# Patient Record
Sex: Female | Born: 1955 | Race: White | Hispanic: No | Smoking: Former smoker
Health system: Southern US, Community
[De-identification: ages and names within clinical notes are randomized; demographics above are authoritative.]

## PROBLEM LIST (undated history)

## (undated) DIAGNOSIS — F419 Anxiety disorder, unspecified: Secondary | ICD-10-CM

## (undated) DIAGNOSIS — F32A Depression, unspecified: Secondary | ICD-10-CM

## (undated) DIAGNOSIS — E119 Type 2 diabetes mellitus without complications: Secondary | ICD-10-CM

## (undated) DIAGNOSIS — E669 Obesity, unspecified: Secondary | ICD-10-CM

## (undated) DIAGNOSIS — Z860100 Personal history of colon polyps, unspecified: Secondary | ICD-10-CM

## (undated) DIAGNOSIS — G4730 Sleep apnea, unspecified: Secondary | ICD-10-CM

## (undated) DIAGNOSIS — E7800 Pure hypercholesterolemia, unspecified: Secondary | ICD-10-CM

## (undated) DIAGNOSIS — J45909 Unspecified asthma, uncomplicated: Secondary | ICD-10-CM

## (undated) DIAGNOSIS — I10 Essential (primary) hypertension: Secondary | ICD-10-CM

## (undated) DIAGNOSIS — I4891 Unspecified atrial fibrillation: Secondary | ICD-10-CM

## (undated) DIAGNOSIS — E079 Disorder of thyroid, unspecified: Secondary | ICD-10-CM

## (undated) DIAGNOSIS — I499 Cardiac arrhythmia, unspecified: Secondary | ICD-10-CM

## (undated) DIAGNOSIS — K1123 Chronic sialoadenitis: Secondary | ICD-10-CM

## (undated) HISTORY — PX: OTHER SURGICAL HISTORY: SHX169

## (undated) HISTORY — DX: Depression, unspecified: F32.A

## (undated) HISTORY — DX: Unspecified atrial fibrillation: I48.91

## (undated) HISTORY — PX: ROTATOR CUFF REPAIR: SHX139

## (undated) HISTORY — DX: Obesity, unspecified: E66.9

## (undated) HISTORY — DX: Unspecified asthma, uncomplicated: J45.909

## (undated) HISTORY — DX: Personal history of colon polyps, unspecified: Z86.0100

## (undated) HISTORY — DX: Chronic sialoadenitis: K11.23

## (undated) HISTORY — DX: Cardiac arrhythmia, unspecified: I49.9

## (undated) HISTORY — DX: Anxiety disorder, unspecified: F41.9

## (undated) HISTORY — DX: Personal history of colonic polyps: Z86.010

## (undated) HISTORY — DX: Essential (primary) hypertension: I10

## (undated) HISTORY — DX: Sleep apnea, unspecified: G47.30

## (undated) HISTORY — DX: Type 2 diabetes mellitus without complications: E11.9

## (undated) HISTORY — PX: CARDIAC ELECTROPHYSIOLOGY MAPPING AND ABLATION: SHX1292

## (undated) HISTORY — DX: Pure hypercholesterolemia, unspecified: E78.00

## (undated) HISTORY — DX: Disorder of thyroid, unspecified: E07.9

---

## 2001-07-01 ENCOUNTER — Ambulatory Visit (HOSPITAL_BASED_OUTPATIENT_CLINIC_OR_DEPARTMENT_OTHER): Admission: RE | Admit: 2001-07-01 | Discharge: 2001-07-01 | Payer: Self-pay | Admitting: Orthopedic Surgery

## 2001-10-06 HISTORY — PX: SUBMANDIBULAR GLAND EXCISION: SHX2456

## 2004-10-19 ENCOUNTER — Encounter: Admission: RE | Admit: 2004-10-19 | Discharge: 2004-10-19 | Payer: Self-pay | Admitting: Orthopedic Surgery

## 2004-10-24 ENCOUNTER — Ambulatory Visit (HOSPITAL_BASED_OUTPATIENT_CLINIC_OR_DEPARTMENT_OTHER): Admission: RE | Admit: 2004-10-24 | Discharge: 2004-10-24 | Payer: Self-pay | Admitting: Orthopedic Surgery

## 2004-10-24 ENCOUNTER — Ambulatory Visit (HOSPITAL_COMMUNITY): Admission: RE | Admit: 2004-10-24 | Discharge: 2004-10-24 | Payer: Self-pay | Admitting: Orthopedic Surgery

## 2004-10-24 ENCOUNTER — Encounter (INDEPENDENT_AMBULATORY_CARE_PROVIDER_SITE_OTHER): Payer: Self-pay | Admitting: Specialist

## 2007-09-04 HISTORY — PX: EXCISION OF BREAST LESION: SHX6676

## 2008-04-07 ENCOUNTER — Ambulatory Visit (HOSPITAL_COMMUNITY): Admission: RE | Admit: 2008-04-07 | Discharge: 2008-04-07 | Payer: Self-pay | Admitting: Neurosurgery

## 2010-08-21 NOTE — Op Note (Signed)
[name], [name] NO.:  [id number]   MEDICAL RECORD NO.:  [id number]          PATIENT TYPE:  OIB   LOCATION:  3533                         FACILITY:  [facility]   PHYSICIAN:  Coletta Memos, M.D.     DATE OF BIRTH:  [birth year]   DATE OF PROCEDURE:  04/07/2008  DATE OF DISCHARGE:  04/07/2008                               OPERATIVE REPORT   PREOPERATIVE DIAGNOSES:  1. C6-7 spondylosis.  2. Left C7 radiculopathy.   POSTOPERATIVE DIAGNOSES:  1. C6-7 spondylosis.  2. Left C7 radiculopathy.   PROCEDURE:  Anterior cervical decompression, arthrodesis C6-7 with 6-mm  structural allograft.   COMPLICATIONS:  None.   INDICATIONS:  Molly Patton presented with severe pain in the left upper  extremity.  MRI revealed significant spondylitic change present at C6-7  along with disk and osteophyte on the left side.  I offered and she  agreed to undergo operative decompression.  She says she was just in too  much pain.   OPERATIVE NOTE:  Molly Patton was brought to the operating room.  She was  intubated and placed under a general anesthetic without difficulty.  Her  head was positioned on a horseshoe headrest in neutral fashion.  Her  neck was prepped and she was draped in a sterile fashion.  I infiltrated  4 mL of 0.5% lidocaine with 1:200,000 strength epinephrine in a skin  fold at the cricoid cartilage.  I opened the skin with a #10 blade and  took this down through the subcutaneous tissue to the platysma.  I  opened the platysma in a horizontal fashion using Metzenbaum scissors.  I then was able to dissect between the planes and identified strap  muscles and the omohyoid.  I retracted the omohyoid medially along with  the strap muscles and then was able to place a needle in the cervical  spine.  First x-ray, I could not see where I placed the needle, so I  placed 2 more needles I could count inferiorly.  Having done I was able  to identify the C6-7 space.  I then placed  distraction pins one at C6  one at C7 and opened the disk space.   I decompressed the spinal canal at C6-7 by removing the disk in a  piecemeal fashion using curettes, pituitary rongeurs, Kerrison punches,  and a high power drill.  I also used the drill to remove osteophytes and  enlarge the disk space and further decompress the spinal canal.  I  thoroughly decompressed both C7 nerve roots on the right and left.  I  removed the uncovertebral osteophyte on the left side and that nerves  had a free egress when I was finished completed.  I then prepared for  the arthrodesis having completed the decompression.   I used high-speed drill to even the surfaces of C6 and C7.  I then  placed a 6-mm allograft into the space.  I then removed the distraction  pins.  I then placed a plate using 2 screws at C6, 2 screws at C7, self-  tapping screw first by  drilling  a hole.  This was done without difficulty.  I did not take  another x-ray as we were unable to see the C6-7 disk space at all.  I  irrigated.  I then closed the wound in layered fashion using Vicryl  sutures to reapproximate the platysma and then subcuticular layers.  Dermabond was used for sterile dressing.           ______________________________  Coletta Memos, M.D.     KC/MEDQ  D:  04/07/2008  T:  04/08/2008  Job:  [id number]

## 2010-08-24 NOTE — Op Note (Signed)
[place]  Patient:    Molly Patton, Molly Patton Visit Number: [id number] MRN: [id number]          Service Type: DSU Location: [facility] Attending Physician:  Alinda Deem Dictated by:   Alinda Deem, M.D. Proc. Date: 07/01/01 Admit Date:  07/01/2001                             Operative Report  PREOPERATIVE DIAGNOSIS:  Left shoulder pain after arthroscopic decompression and rotator cuff repair with mass in the subacromial space by MRI scan.  POSTOPERATIVE DIAGNOSIS:  Loose suture in the subacromial space of the left shoulder, some residual anterolateral spur of the subacromion and minor tearing of the labrum.  OPERATION PERFORMED:  Left shoulder arthroscopic debridement of the subacromial space with removal of ball of suture.  Redo acromioplasty removing the anterolateral aspect of the acromial spur.  Incidental debridement of the labrum when we were evaluating the glenohumeral joint.  The rotator cuff was intact.  SURGEON:  Alinda Deem, M.D.  ASSISTANT:  Dorthula Matas, P.A.-C.  ANESTHESIA:  General endotracheal.  ESTIMATED BLOOD LOSS:  Minimal.  FLUID REPLACEMENT:  800 cc crystalloid.  DRAINS:  None.  TOURNIQUET TIME:  None.  INDICATIONS FOR PROCEDURE:  The patient is a 55 year old woman with left shoulder impingement syndrome who underwent arthroscopic acromioplasty and open miniarthrotomy and rotator cuff repair by another physician last year. She has had persistent pain, catching and popping.  I did a second opinion evaluation on her a month or two ago and the MRI scan that I ordered showed a 7 or 8 mm ball-like mass deforming the rotator cuff in the subacromial space. The plain x-rays were clear and because of the persistent pain, popping and catching, she is taken for arthroscopic evaluation and treatment of the left shoulder.  DESCRIPTION OF PROCEDURE:  The patient was identified by arm band and taken to the  operating room at [facility] where the appropriate anesthetic monitors were attached and general endotracheal anesthesia induced with the patient in the supine position.  She was then placed in the beach chair position and the left upper extremity prepped and draped in the usual sterile fashion from the wrist to the hemithorax.  The skin along the anterolateral and posterior aspects of the acromion process was infiltrated with 3 to 4 cc 0.5% Marcaine with epinephrine solution per quadrant and then standard portals were made 1.5 cm anterior to the [facility] joint, lateral to the junction of the middle and posterior thirds of the acromion, posterior to the posterolateral corner of the acromion process with a #11 blade.  The inflow was placed anteriorly into the subacromial space, the arthroscope laterally and a 4.2 mm [place] su[place] shaver posteriorly allowing subacromial bursectomy although we immediately identified a ball of suture that was floating around in the subacromial space loose and this was removed with a grasping forceps.  We then performed a subacromial bursectomy, identified anterolateral spurring of the acromion which was debrided back to a stable margin using a 4.5 hooded Vortex bur.  The spur that was anterolateral was somewhat sharp and probably was causing some impingement.  The arthroscope was then repositioned into the glenohumeral joint using a posterior portal allowing [place] to debride an incidental labral tear.  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 At this point  the shoulder was washed out with normal saline solution.  The [facility] joint was also evaluated and showed some remaining cartilage and she had not been particularly tender over the [facility] joint.  The arthroscopic instruments were removed.  A dressing of Xeroform, 4 x 4 dressing sponges, and paper tape applied.  The patient  was awakened and taken to the recovery room without difficulty. Dictated by:   Alinda Deem, M.D. Attending Physician:  Alinda Deem DD:  07/01/01 TD:  07/02/01 Job: [id number]

## 2010-08-24 NOTE — Op Note (Signed)
Molly Patton, Molly Patton               ACCOUNT NO.:  [id number]   MEDICAL RECORD NO.:  [id number]          PATIENT TYPE:  AMB   LOCATION:  DSC                          FACILITY:  [facility]   PHYSICIAN:  Cindee Salt, M.D.       DATE OF BIRTH:  11/28/1955   DATE OF PROCEDURE:  10/24/2004  DATE OF DISCHARGE:                                 OPERATIVE REPORT   PREOPERATIVE DIAGNOSIS:  Ulnar neuropathy with ganglion cyst, Guillain's  canal, right wrist.   POSTOPERATIVE DIAGNOSIS:  Ulnar neuropathy with ganglion cyst, Guillain's  canal, right wrist.   OPERATION:  Release Guillain's canal, excision volar ulnar wrist ganglion,  right wrist.   SURGEON:  Kuzma.   ASSISTANT:  Carolyne Fiscal R.N.   ANESTHESIA:  General.   HISTORY:  The patient is a 55 year old female with a history of numbness and  tingling ring little finger, tenderness at her wrist. MRI reveals a cyst in  Guillain's canal.  Nerve conductions reveal ulnar neuropathy secondary to  the cyst and compression.   PROCEDURE:  The patient is brought to the operating room where a general  anesthetic was carried out without difficulty after marking the arm  confirming with the patient.  The limb was exsanguinated with an Esmarch  bandage.  Tourniquet placed on the arm was inflated 250 mmHg.  Prep was done  with DuraPrep.  The volar zigzag incision was made to allow extension  proximally and distally, carried down through subcutaneous tissue. The  overlying musculature to Guillain's canal was then released.  The nerve was  identified along with the artery.  Deep branch was then also released.  The  wound was extended slightly proximal and a large cyst was present. This was  found to arise from the radiocarpal joint with blunt dissection after  placing retractors for protection of the superficial and deep branch of the  ulnar nerve. The cyst was excised in toto. The area capsule was debrided.  This arose from the ulna triquetral ligament which was  obviously deficient.  The wound was copiously irrigated with saline. The subcutaneous tissue was  then closed with interrupted 4-0 Vicryl sutures and the skin with a  subcuticular 3-0 Monocryl suture. Steri-Strips were applied. Sterile  compressive dressing and splint applied. The patient tolerated the procedure  well and was taken to the recovery room for observation in satisfactory  condition. She is discharged home to return to the [facility]  in one week on Vicodin.       GK/MEDQ  D:  10/24/2004  T:  10/24/2004  Job:  [id number]

## 2011-01-10 LAB — BASIC METABOLIC PANEL
BUN: 12 mg/dL (ref 6–23)
Calcium: 8.9 mg/dL (ref 8.4–10.5)
Chloride: 104 mEq/L (ref 96–112)
GFR calc Af Amer: >60 mL/min (ref >60)
GFR calc non Af Amer: >60 mL/min (ref >60)
Glucose, Bld: 107 mg/dL — ABNORMAL HIGH (ref 70–99)
Potassium: 4.6 mEq/L (ref 3.5–5.1)

## 2011-01-10 LAB — CBC
MCHC: 32.8 g/dL (ref 30.0–36.0)
MCV: 90.7 fL (ref 78.0–100.0)
RBC: 4.53 MIL/uL (ref 3.87–5.11)
RDW: 14.9 % (ref 11.5–15.5)
WBC: 8.4 10*3/uL (ref 4.0–10.5)

## 2013-12-28 DIAGNOSIS — E0500 Thyrotoxicosis with diffuse goiter without thyrotoxic crisis or storm: Secondary | ICD-10-CM | POA: Insufficient documentation

## 2014-09-14 ENCOUNTER — Other Ambulatory Visit: Payer: Self-pay

## 2014-09-14 HISTORY — PX: COLONOSCOPY: SHX174

## 2014-10-21 DIAGNOSIS — H50041 Monocular esotropia with other noncomitancies, right eye: Secondary | ICD-10-CM | POA: Insufficient documentation

## 2014-10-21 DIAGNOSIS — H532 Diplopia: Secondary | ICD-10-CM | POA: Insufficient documentation

## 2014-12-17 DIAGNOSIS — H02534 Eyelid retraction left upper eyelid: Secondary | ICD-10-CM | POA: Insufficient documentation

## 2014-12-17 DIAGNOSIS — H05243 Constant exophthalmos, bilateral: Secondary | ICD-10-CM | POA: Insufficient documentation

## 2015-01-24 DIAGNOSIS — I480 Paroxysmal atrial fibrillation: Secondary | ICD-10-CM | POA: Insufficient documentation

## 2015-02-14 DIAGNOSIS — I471 Supraventricular tachycardia: Secondary | ICD-10-CM | POA: Insufficient documentation

## 2015-02-14 DIAGNOSIS — Z8639 Personal history of other endocrine, nutritional and metabolic disease: Secondary | ICD-10-CM | POA: Insufficient documentation

## 2015-05-09 DIAGNOSIS — N179 Acute kidney failure, unspecified: Secondary | ICD-10-CM | POA: Insufficient documentation

## 2015-05-09 DIAGNOSIS — R7302 Impaired glucose tolerance (oral): Secondary | ICD-10-CM | POA: Insufficient documentation

## 2015-06-28 DIAGNOSIS — G4734 Idiopathic sleep related nonobstructive alveolar hypoventilation: Secondary | ICD-10-CM | POA: Insufficient documentation

## 2015-06-28 DIAGNOSIS — G4733 Obstructive sleep apnea (adult) (pediatric): Secondary | ICD-10-CM | POA: Insufficient documentation

## 2016-06-13 DIAGNOSIS — Z79899 Other long term (current) drug therapy: Secondary | ICD-10-CM | POA: Diagnosis not present

## 2016-06-13 DIAGNOSIS — E039 Hypothyroidism, unspecified: Secondary | ICD-10-CM | POA: Diagnosis not present

## 2016-06-13 DIAGNOSIS — M1990 Unspecified osteoarthritis, unspecified site: Secondary | ICD-10-CM | POA: Diagnosis not present

## 2016-06-13 DIAGNOSIS — E099 Drug or chemical induced diabetes mellitus without complications: Secondary | ICD-10-CM | POA: Diagnosis not present

## 2016-08-07 DIAGNOSIS — I480 Paroxysmal atrial fibrillation: Secondary | ICD-10-CM | POA: Diagnosis not present

## 2016-08-07 DIAGNOSIS — G4733 Obstructive sleep apnea (adult) (pediatric): Secondary | ICD-10-CM | POA: Diagnosis not present

## 2016-08-07 DIAGNOSIS — Z8639 Personal history of other endocrine, nutritional and metabolic disease: Secondary | ICD-10-CM | POA: Diagnosis not present

## 2016-08-26 DIAGNOSIS — Z1389 Encounter for screening for other disorder: Secondary | ICD-10-CM | POA: Diagnosis not present

## 2016-08-26 DIAGNOSIS — M545 Low back pain: Secondary | ICD-10-CM | POA: Diagnosis not present

## 2016-08-26 DIAGNOSIS — M5136 Other intervertebral disc degeneration, lumbar region: Secondary | ICD-10-CM | POA: Diagnosis not present

## 2016-08-30 DIAGNOSIS — H532 Diplopia: Secondary | ICD-10-CM | POA: Diagnosis not present

## 2016-08-30 DIAGNOSIS — H26493 Other secondary cataract, bilateral: Secondary | ICD-10-CM | POA: Diagnosis not present

## 2016-08-30 DIAGNOSIS — H0520 Unspecified exophthalmos: Secondary | ICD-10-CM | POA: Diagnosis not present

## 2016-09-09 DIAGNOSIS — Z0000 Encounter for general adult medical examination without abnormal findings: Secondary | ICD-10-CM | POA: Diagnosis not present

## 2016-09-09 DIAGNOSIS — Z1231 Encounter for screening mammogram for malignant neoplasm of breast: Secondary | ICD-10-CM | POA: Diagnosis not present

## 2016-09-27 DIAGNOSIS — M818 Other osteoporosis without current pathological fracture: Secondary | ICD-10-CM | POA: Diagnosis not present

## 2016-09-27 DIAGNOSIS — Z1231 Encounter for screening mammogram for malignant neoplasm of breast: Secondary | ICD-10-CM | POA: Diagnosis not present

## 2016-09-27 DIAGNOSIS — M810 Age-related osteoporosis without current pathological fracture: Secondary | ICD-10-CM | POA: Diagnosis not present

## 2016-09-27 DIAGNOSIS — M8589 Other specified disorders of bone density and structure, multiple sites: Secondary | ICD-10-CM | POA: Diagnosis not present

## 2016-11-14 DIAGNOSIS — E039 Hypothyroidism, unspecified: Secondary | ICD-10-CM | POA: Diagnosis not present

## 2016-12-24 DIAGNOSIS — M1990 Unspecified osteoarthritis, unspecified site: Secondary | ICD-10-CM | POA: Diagnosis not present

## 2016-12-24 DIAGNOSIS — E039 Hypothyroidism, unspecified: Secondary | ICD-10-CM | POA: Diagnosis not present

## 2017-01-06 DIAGNOSIS — I480 Paroxysmal atrial fibrillation: Secondary | ICD-10-CM | POA: Diagnosis not present

## 2017-01-10 DIAGNOSIS — M545 Low back pain: Secondary | ICD-10-CM | POA: Diagnosis not present

## 2017-01-10 DIAGNOSIS — M5126 Other intervertebral disc displacement, lumbar region: Secondary | ICD-10-CM | POA: Diagnosis not present

## 2017-02-11 DIAGNOSIS — I480 Paroxysmal atrial fibrillation: Secondary | ICD-10-CM | POA: Diagnosis not present

## 2017-02-11 DIAGNOSIS — M545 Low back pain: Secondary | ICD-10-CM | POA: Diagnosis not present

## 2017-02-11 DIAGNOSIS — J019 Acute sinusitis, unspecified: Secondary | ICD-10-CM | POA: Diagnosis not present

## 2017-03-11 DIAGNOSIS — E039 Hypothyroidism, unspecified: Secondary | ICD-10-CM | POA: Diagnosis not present

## 2017-03-11 DIAGNOSIS — E785 Hyperlipidemia, unspecified: Secondary | ICD-10-CM | POA: Diagnosis not present

## 2017-03-11 DIAGNOSIS — E0500 Thyrotoxicosis with diffuse goiter without thyrotoxic crisis or storm: Secondary | ICD-10-CM | POA: Diagnosis not present

## 2017-03-11 DIAGNOSIS — Z79899 Other long term (current) drug therapy: Secondary | ICD-10-CM | POA: Diagnosis not present

## 2017-03-11 DIAGNOSIS — I480 Paroxysmal atrial fibrillation: Secondary | ICD-10-CM | POA: Diagnosis not present

## 2017-03-25 DIAGNOSIS — G8929 Other chronic pain: Secondary | ICD-10-CM | POA: Insufficient documentation

## 2017-03-25 DIAGNOSIS — M1712 Unilateral primary osteoarthritis, left knee: Secondary | ICD-10-CM | POA: Diagnosis not present

## 2017-03-25 DIAGNOSIS — M25562 Pain in left knee: Secondary | ICD-10-CM | POA: Diagnosis not present

## 2017-03-25 DIAGNOSIS — M25361 Other instability, right knee: Secondary | ICD-10-CM | POA: Diagnosis not present

## 2017-04-02 DIAGNOSIS — M25562 Pain in left knee: Secondary | ICD-10-CM | POA: Diagnosis not present

## 2017-04-02 DIAGNOSIS — M179 Osteoarthritis of knee, unspecified: Secondary | ICD-10-CM | POA: Diagnosis not present

## 2017-04-22 DIAGNOSIS — M1712 Unilateral primary osteoarthritis, left knee: Secondary | ICD-10-CM | POA: Diagnosis not present

## 2017-04-28 DIAGNOSIS — G4733 Obstructive sleep apnea (adult) (pediatric): Secondary | ICD-10-CM | POA: Diagnosis not present

## 2017-04-30 DIAGNOSIS — I10 Essential (primary) hypertension: Secondary | ICD-10-CM | POA: Insufficient documentation

## 2017-04-30 DIAGNOSIS — J45909 Unspecified asthma, uncomplicated: Secondary | ICD-10-CM | POA: Diagnosis not present

## 2017-04-30 DIAGNOSIS — G4733 Obstructive sleep apnea (adult) (pediatric): Secondary | ICD-10-CM | POA: Diagnosis not present

## 2017-04-30 DIAGNOSIS — I480 Paroxysmal atrial fibrillation: Secondary | ICD-10-CM | POA: Diagnosis not present

## 2017-05-13 DIAGNOSIS — M170 Bilateral primary osteoarthritis of knee: Secondary | ICD-10-CM | POA: Diagnosis not present

## 2017-05-13 DIAGNOSIS — G8929 Other chronic pain: Secondary | ICD-10-CM | POA: Diagnosis not present

## 2017-06-05 DIAGNOSIS — E039 Hypothyroidism, unspecified: Secondary | ICD-10-CM | POA: Diagnosis not present

## 2017-06-05 DIAGNOSIS — E785 Hyperlipidemia, unspecified: Secondary | ICD-10-CM | POA: Diagnosis not present

## 2017-06-05 DIAGNOSIS — I10 Essential (primary) hypertension: Secondary | ICD-10-CM | POA: Diagnosis not present

## 2017-06-23 DIAGNOSIS — J111 Influenza due to unidentified influenza virus with other respiratory manifestations: Secondary | ICD-10-CM | POA: Diagnosis not present

## 2017-06-23 DIAGNOSIS — J209 Acute bronchitis, unspecified: Secondary | ICD-10-CM | POA: Diagnosis not present

## 2017-07-02 DIAGNOSIS — M5126 Other intervertebral disc displacement, lumbar region: Secondary | ICD-10-CM | POA: Diagnosis not present

## 2017-07-02 DIAGNOSIS — M47817 Spondylosis without myelopathy or radiculopathy, lumbosacral region: Secondary | ICD-10-CM | POA: Diagnosis not present

## 2017-08-14 DIAGNOSIS — G8929 Other chronic pain: Secondary | ICD-10-CM | POA: Diagnosis not present

## 2017-08-14 DIAGNOSIS — M25561 Pain in right knee: Secondary | ICD-10-CM | POA: Diagnosis not present

## 2017-08-14 DIAGNOSIS — M25562 Pain in left knee: Secondary | ICD-10-CM | POA: Diagnosis not present

## 2017-08-22 ENCOUNTER — Encounter: Payer: Self-pay | Admitting: Gastroenterology

## 2017-08-26 DIAGNOSIS — M9903 Segmental and somatic dysfunction of lumbar region: Secondary | ICD-10-CM | POA: Diagnosis not present

## 2017-08-26 DIAGNOSIS — M9905 Segmental and somatic dysfunction of pelvic region: Secondary | ICD-10-CM | POA: Diagnosis not present

## 2017-08-26 DIAGNOSIS — M5416 Radiculopathy, lumbar region: Secondary | ICD-10-CM | POA: Diagnosis not present

## 2017-09-02 DIAGNOSIS — H26493 Other secondary cataract, bilateral: Secondary | ICD-10-CM | POA: Diagnosis not present

## 2017-09-02 DIAGNOSIS — M9903 Segmental and somatic dysfunction of lumbar region: Secondary | ICD-10-CM | POA: Diagnosis not present

## 2017-09-02 DIAGNOSIS — M5416 Radiculopathy, lumbar region: Secondary | ICD-10-CM | POA: Diagnosis not present

## 2017-09-02 DIAGNOSIS — M9905 Segmental and somatic dysfunction of pelvic region: Secondary | ICD-10-CM | POA: Diagnosis not present

## 2017-09-02 DIAGNOSIS — H04123 Dry eye syndrome of bilateral lacrimal glands: Secondary | ICD-10-CM | POA: Diagnosis not present

## 2017-09-08 DIAGNOSIS — E039 Hypothyroidism, unspecified: Secondary | ICD-10-CM | POA: Diagnosis not present

## 2017-09-08 DIAGNOSIS — I10 Essential (primary) hypertension: Secondary | ICD-10-CM | POA: Diagnosis not present

## 2017-09-08 DIAGNOSIS — Z79899 Other long term (current) drug therapy: Secondary | ICD-10-CM | POA: Diagnosis not present

## 2017-09-08 DIAGNOSIS — E785 Hyperlipidemia, unspecified: Secondary | ICD-10-CM | POA: Diagnosis not present

## 2017-09-10 DIAGNOSIS — L600 Ingrowing nail: Secondary | ICD-10-CM | POA: Diagnosis not present

## 2017-09-10 DIAGNOSIS — L603 Nail dystrophy: Secondary | ICD-10-CM | POA: Diagnosis not present

## 2017-09-13 DIAGNOSIS — L600 Ingrowing nail: Secondary | ICD-10-CM | POA: Insufficient documentation

## 2017-09-13 DIAGNOSIS — L603 Nail dystrophy: Secondary | ICD-10-CM | POA: Insufficient documentation

## 2017-11-03 ENCOUNTER — Encounter: Payer: Self-pay | Admitting: Podiatry

## 2017-11-03 ENCOUNTER — Ambulatory Visit: Payer: Commercial Managed Care - PPO | Admitting: Podiatry

## 2017-11-03 ENCOUNTER — Ambulatory Visit (INDEPENDENT_AMBULATORY_CARE_PROVIDER_SITE_OTHER): Payer: Commercial Managed Care - PPO

## 2017-11-03 VITALS — BP 136/76 | HR 82 | Temp 98.0°F | Resp 16 | Ht 65.0 in | Wt 297.0 lb

## 2017-11-03 DIAGNOSIS — M722 Plantar fascial fibromatosis: Secondary | ICD-10-CM

## 2017-11-03 DIAGNOSIS — M7662 Achilles tendinitis, left leg: Secondary | ICD-10-CM | POA: Diagnosis not present

## 2017-11-03 DIAGNOSIS — M216X9 Other acquired deformities of unspecified foot: Secondary | ICD-10-CM

## 2017-11-03 NOTE — Progress Notes (Signed)
   Subjective:    Patient ID: Molly Patton, female    DOB: 10/26/1955, 62 y.o.   MRN: [id number]  HPI    Review of Systems  Musculoskeletal: Positive for arthralgias, joint swelling and myalgias.  All other systems reviewed and are negative.      Objective:   Physical Exam        Assessment & Plan:

## 2017-11-03 NOTE — Patient Instructions (Signed)
Plantar Fasciitis (Heel Spur Syndrome) with Rehab The plantar fascia is a fibrous, ligament-like, soft-tissue structure that spans the bottom of the foot. Plantar fasciitis is a condition that causes pain in the foot due to inflammation of the tissue. SYMPTOMS   Pain and tenderness on the underneath side of the foot.  Pain that worsens with standing or walking. CAUSES  Plantar fasciitis is caused by irritation and injury to the plantar fascia on the underneath side of the foot. Common mechanisms of injury include:  Direct trauma to bottom of the foot.  Damage to a small nerve that runs under the foot where the main fascia attaches to the heel bone.  Stress placed on the plantar fascia due to bone spurs. RISK INCREASES WITH:   Activities that place stress on the plantar fascia (running, jumping, pivoting, or cutting).  Poor strength and flexibility.  Improperly fitted shoes.  Tight calf muscles.  Flat feet.  Failure to warm-up properly before activity.  Obesity. PREVENTION  Warm up and stretch properly before activity.  Allow for adequate recovery between workouts.  Maintain physical fitness:  Strength, flexibility, and endurance.  Cardiovascular fitness.  Maintain a health body weight.  Avoid stress on the plantar fascia.  Wear properly fitted shoes, including arch supports for individuals who have flat feet.  PROGNOSIS  If treated properly, then the symptoms of plantar fasciitis usually resolve without surgery. However, occasionally surgery is necessary.  RELATED COMPLICATIONS   Recurrent symptoms that may result in a chronic condition.  Problems of the lower back that are caused by compensating for the injury, such as limping.  Pain or weakness of the foot during push-off following surgery.  Chronic inflammation, scarring, and partial or complete fascia tear, occurring more often from repeated injections.  TREATMENT  Treatment initially involves the  use of ice and medication to help reduce pain and inflammation. The use of strengthening and stretching exercises may help reduce pain with activity, especially stretches of the Achilles tendon. These exercises may be performed at home or with a therapist. Your caregiver may recommend that you use heel cups of arch supports to help reduce stress on the plantar fascia. Occasionally, corticosteroid injections are given to reduce inflammation. If symptoms persist for greater than 6 months despite non-surgical (conservative), then surgery may be recommended.   MEDICATION   If pain medication is necessary, then nonsteroidal anti-inflammatory medications, such as aspirin and ibuprofen, or other minor pain relievers, such as acetaminophen, are often recommended.  Do not take pain medication within 7 days before surgery.  Prescription pain relievers may be given if deemed necessary by your caregiver. Use only as directed and only as much as you need.  Corticosteroid injections may be given by your caregiver. These injections should be reserved for the most serious cases, because they may only be given a certain number of times.  HEAT AND COLD  Cold treatment (icing) relieves pain and reduces inflammation. Cold treatment should be applied for 10 to 15 minutes every 2 to 3 hours for inflammation and pain and immediately after any activity that aggravates your symptoms. Use ice packs or massage the area with a piece of ice (ice massage).  Heat treatment may be used prior to performing the stretching and strengthening activities prescribed by your caregiver, physical therapist, or athletic trainer. Use a heat pack or soak the injury in warm water.  SEEK IMMEDIATE MEDICAL CARE IF:  Treatment seems to offer no benefit, or the condition worsens.  Any medications   produce adverse side effects.  EXERCISES- RANGE OF MOTION (ROM) AND STRETCHING EXERCISES - Plantar Fasciitis (Heel Spur Syndrome) These exercises  may help you when beginning to rehabilitate your injury. Your symptoms may resolve with or without further involvement from your physician, physical therapist or athletic trainer. While completing these exercises, remember:   Restoring tissue flexibility helps normal motion to return to the joints. This allows healthier, less painful movement and activity.  An effective stretch should be held for at least 30 seconds.  A stretch should never be painful. You should only feel a gentle lengthening or release in the stretched tissue.  RANGE OF MOTION - Toe Extension, Flexion  Sit with your right / left leg crossed over your opposite knee.  Grasp your toes and gently pull them back toward the top of your foot. You should feel a stretch on the bottom of your toes and/or foot.  Hold this stretch for 10 seconds.  Now, gently pull your toes toward the bottom of your foot. You should feel a stretch on the top of your toes and or foot.  Hold this stretch for 10 seconds. Repeat  times. Complete this stretch 3 times per day.   RANGE OF MOTION - Ankle Dorsiflexion, Active Assisted  Remove shoes and sit on a chair that is preferably not on a carpeted surface.  Place right / left foot under knee. Extend your opposite leg for support.  Keeping your heel down, slide your right / left foot back toward the chair until you feel a stretch at your ankle or calf. If you do not feel a stretch, slide your bottom forward to the edge of the chair, while still keeping your heel down.  Hold this stretch for 10 seconds. Repeat 3 times. Complete this stretch 2 times per day.   STRETCH  Gastroc, Standing  Place hands on wall.  Extend right / left leg, keeping the front knee somewhat bent.  Slightly point your toes inward on your back foot.  Keeping your right / left heel on the floor and your knee straight, shift your weight toward the wall, not allowing your back to arch.  You should feel a gentle stretch  in the right / left calf. Hold this position for 10 seconds. Repeat 3 times. Complete this stretch 2 times per day.  STRETCH  Soleus, Standing  Place hands on wall.  Extend right / left leg, keeping the other knee somewhat bent.  Slightly point your toes inward on your back foot.  Keep your right / left heel on the floor, bend your back knee, and slightly shift your weight over the back leg so that you feel a gentle stretch deep in your back calf.  Hold this position for 10 seconds. Repeat 3 times. Complete this stretch 2 times per day.  STRETCH  Gastrocsoleus, Standing  Note: This exercise can place a lot of stress on your foot and ankle. Please complete this exercise only if specifically instructed by your caregiver.   Place the ball of your right / left foot on a step, keeping your other foot firmly on the same step.  Hold on to the wall or a rail for balance.  Slowly lift your other foot, allowing your body weight to press your heel down over the edge of the step.  You should feel a stretch in your right / left calf.  Hold this position for 10 seconds.  Repeat this exercise with a slight bend in your right /   left knee. Repeat 3 times. Complete this stretch 2 times per day.   STRENGTHENING EXERCISES - Plantar Fasciitis (Heel Spur Syndrome)  These exercises may help you when beginning to rehabilitate your injury. They may resolve your symptoms with or without further involvement from your physician, physical therapist or athletic trainer. While completing these exercises, remember:   Muscles can gain both the endurance and the strength needed for everyday activities through controlled exercises.  Complete these exercises as instructed by your physician, physical therapist or athletic trainer. Progress the resistance and repetitions only as guided.  STRENGTH - Towel Curls  Sit in a chair positioned on a non-carpeted surface.  Place your foot on a towel, keeping your heel  on the floor.  Pull the towel toward your heel by only curling your toes. Keep your heel on the floor. Repeat 3 times. Complete this exercise 2 times per day.  STRENGTH - Ankle Inversion  Secure one end of a rubber exercise band/tubing to a fixed object (table, pole). Loop the other end around your foot just before your toes.  Place your fists between your knees. This will focus your strengthening at your ankle.  Slowly, pull your big toe up and in, making sure the band/tubing is positioned to resist the entire motion.  Hold this position for 10 seconds.  Have your muscles resist the band/tubing as it slowly pulls your foot back to the starting position. Repeat 3 times. Complete this exercises 2 times per day.  Document Released: 03/25/2005 Document Revised: 06/17/2011 Document Reviewed: 07/07/2008 [employer] Patient Information 2014 [employer]. Achilles Tendinitis  with Rehab Achilles tendinitis is a disorder of the Achilles tendon. The Achilles tendon connects the large calf muscles (Gastrocnemius and Soleus) to the heel bone (calcaneus). This tendon is sometimes called the heel cord. It is important for pushing-off and standing on your toes and is important for walking, running, or jumping. Tendinitis is often caused by overuse and repetitive microtrauma. SYMPTOMS  Pain, tenderness, swelling, warmth, and redness may occur over the Achilles tendon even at rest.  Pain with pushing off, or flexing or extending the ankle.  Pain that is worsened after or during activity. CAUSES   Overuse sometimes seen with rapid increase in exercise programs or in sports requiring running and jumping.  Poor physical conditioning (strength and flexibility or endurance).  Running sports, especially training running down hills.  Inadequate warm-up before practice or play or failure to stretch before participation.  Injury to the tendon. PREVENTION   Warm up and stretch before practice or  competition.  Allow time for adequate rest and recovery between practices and competition.  Keep up conditioning.  Keep up ankle and leg flexibility.  Improve or keep muscle strength and endurance.  Improve cardiovascular fitness.  Use proper technique.  Use proper equipment (shoes, skates).  To help prevent recurrence, taping, protective strapping, or an adhesive bandage may be recommended for several weeks after healing is complete. PROGNOSIS   Recovery may take weeks to several months to heal.  Longer recovery is expected if symptoms have been prolonged.  Recovery is usually quicker if the inflammation is due to a direct blow as compared with overuse or sudden strain. RELATED COMPLICATIONS   Healing time will be prolonged if the condition is not correctly treated. The injury must be given plenty of time to heal.  Symptoms can reoccur if activity is resumed too soon.  Untreated, tendinitis may increase the risk of tendon rupture requiring additional time for recovery   and possibly surgery. TREATMENT   The first treatment consists of rest anti-inflammatory medication, and ice to relieve the pain.  Stretching and strengthening exercises after resolution of pain will likely help reduce the risk of recurrence. Referral to a physical therapist or athletic trainer for further evaluation and treatment may be helpful.  A walking boot or cast may be recommended to rest the Achilles tendon. This can help break the cycle of inflammation and microtrauma.  Arch supports (orthotics) may be prescribed or recommended by your caregiver as an adjunct to therapy and rest.  Surgery to remove the inflamed tendon lining or degenerated tendon tissue is rarely necessary and has shown less than predictable results. MEDICATION   Nonsteroidal anti-inflammatory medications, such as aspirin and ibuprofen, may be used for pain and inflammation relief. Do not take within 7 days before surgery. Take  these as directed by your caregiver. Contact your caregiver immediately if any bleeding, stomach upset, or signs of allergic reaction occur. Other minor pain relievers, such as acetaminophen, may also be used.  Pain relievers may be prescribed as necessary by your caregiver. Do not take prescription pain medication for longer than 4 to 7 days. Use only as directed and only as much as you need.  Cortisone injections are rarely indicated. Cortisone injections may weaken tendons and predispose to rupture. It is better to give the condition more time to heal than to use them. HEAT AND COLD  Cold is used to relieve pain and reduce inflammation for acute and chronic Achilles tendinitis. Cold should be applied for 10 to 15 minutes every 2 to 3 hours for inflammation and pain and immediately after any activity that aggravates your symptoms. Use ice packs or an ice massage.  Heat may be used before performing stretching and strengthening activities prescribed by your caregiver. Use a heat pack or a warm soak. SEEK MEDICAL CARE IF:  Symptoms get worse or do not improve in 2 weeks despite treatment.  New, unexplained symptoms develop. Drugs used in treatment may produce side effects.  EXERCISES:  RANGE OF MOTION (ROM) AND STRETCHING EXERCISES - Achilles Tendinitis  These exercises may help you when beginning to rehabilitate your injury. Your symptoms may resolve with or without further involvement from your physician, physical therapist or athletic trainer. While completing these exercises, remember:   Restoring tissue flexibility helps normal motion to return to the joints. This allows healthier, less painful movement and activity.  An effective stretch should be held for at least 30 seconds.  A stretch should never be painful. You should only feel a gentle lengthening or release in the stretched tissue.  STRETCH  Gastroc, Standing   Place hands on wall.  Extend right / left leg, keeping the  front knee somewhat bent.  Slightly point your toes inward on your back foot.  Keeping your right / left heel on the floor and your knee straight, shift your weight toward the wall, not allowing your back to arch.  You should feel a gentle stretch in the right / left calf. Hold this position for 10 seconds. Repeat 3 times. Complete this stretch 2 times per day.  STRETCH  Soleus, Standing   Place hands on wall.  Extend right / left leg, keeping the other knee somewhat bent.  Slightly point your toes inward on your back foot.  Keep your right / left heel on the floor, bend your back knee, and slightly shift your weight over the back leg so that you feel a   gentle stretch deep in your back calf.  Hold this position for 10 seconds. Repeat 3 times. Complete this stretch 2 times per day.  STRETCH  Gastrocsoleus, Standing  Note: This exercise can place a lot of stress on your foot and ankle. Please complete this exercise only if specifically instructed by your caregiver.   Place the ball of your right / left foot on a step, keeping your other foot firmly on the same step.  Hold on to the wall or a rail for balance.  Slowly lift your other foot, allowing your body weight to press your heel down over the edge of the step.  You should feel a stretch in your right / left calf.  Hold this position for 10 seconds.  Repeat this exercise with a slight bend in your knee. Repeat 3 times. Complete this stretch 2 times per day.   STRENGTHENING EXERCISES - Achilles Tendinitis These exercises may help you when beginning to rehabilitate your injury. They may resolve your symptoms with or without further involvement from your physician, physical therapist or athletic trainer. While completing these exercises, remember:   Muscles can gain both the endurance and the strength needed for everyday activities through controlled exercises.  Complete these exercises as instructed by your physician,  physical therapist or athletic trainer. Progress the resistance and repetitions only as guided.  You may experience muscle soreness or fatigue, but the pain or discomfort you are trying to eliminate should never worsen during these exercises. If this pain does worsen, stop and make certain you are following the directions exactly. If the pain is still present after adjustments, discontinue the exercise until you can discuss the trouble with your clinician.  STRENGTH - Plantar-flexors   Sit with your right / left leg extended. Holding onto both ends of a rubber exercise band/tubing, loop it around the ball of your foot. Keep a slight tension in the band.  Slowly push your toes away from you, pointing them downward.  Hold this position for 10 seconds. Return slowly, controlling the tension in the band/tubing. Repeat 3 times. Complete this exercise 2 times per day.   STRENGTH - Plantar-flexors   Stand with your feet shoulder width apart. Steady yourself with a wall or table using as little support as needed.  Keeping your weight evenly spread over the width of your feet, rise up on your toes.*  Hold this position for 10 seconds. Repeat 3 times. Complete this exercise 2 times per day.  *If this is too easy, shift your weight toward your right / left leg until you feel challenged. Ultimately, you may be asked to do this exercise with your right / left foot only.  STRENGTH  Plantar-flexors, Eccentric  Note: This exercise can place a lot of stress on your foot and ankle. Please complete this exercise only if specifically instructed by your caregiver.   Place the balls of your feet on a step. With your hands, use only enough support from a wall or rail to keep your balance.  Keep your knees straight and rise up on your toes.  Slowly shift your weight entirely to your right / left toes and pick up your opposite foot. Gently and with controlled movement, lower your weight through your right /  left foot so that your heel drops below the level of the step. You will feel a slight stretch in the back of your calf at the end position.  Use the healthy leg to help rise up onto   the balls of both feet, then lower weight only on the right / left leg again. Build up to 15 repetitions. Then progress to 3 consecutive sets of 15 repetitions.*  After completing the above exercise, complete the same exercise with a slight knee bend (about 30 degrees). Again, build up to 15 repetitions. Then progress to 3 consecutive sets of 15 repetitions.* Perform this exercise 2 times per day.  *When you easily complete 3 sets of 15, your physician, physical therapist or athletic trainer may advise you to add resistance by wearing a backpack filled with additional weight.  STRENGTH - Plantar Flexors, Seated   Sit on a chair that allows your feet to rest flat on the ground. If necessary, sit at the edge of the chair.  Keeping your toes firmly on the ground, lift your right / left heel as far as you can without increasing any discomfort in your ankle. Repeat 3 times. Complete this exercise 2 times a day.  

## 2017-11-04 NOTE — Progress Notes (Signed)
  Subjective:  Patient ID: Molly Patton, female    DOB: 1956/02/19,  MRN: [id number]  Chief Complaint  Patient presents with  . Foot Pain    L back heel pain radiates to bottom heel x Feb-19; 10/10 sharp pain from sitting to standing -no ijjury Tx: epsom sat and ROM   62 y.o. female presents with the above complaint.  Nursing note and vitals reviewed. No past medical history on file.  Current Outpatient Medications:  .  apixaban (ELIQUIS) 5 MG TABS tablet, Take 5 mg by mouth 2 (two) times daily., Disp: , Rfl:  .  Fluticasone-Salmeterol (ADVAIR) 250-50 MCG/DOSE AEPB, Inhale 1 puff into the lungs 2 (two) times daily., Disp: , Rfl:  .  lisinopril (PRINIVIL,ZESTRIL) 40 MG tablet, Take 40 mg by mouth daily., Disp: , Rfl:  .  omeprazole (PRILOSEC) 40 MG capsule, Take 40 mg by mouth daily., Disp: , Rfl:  .  thyroid (ARMOUR) 120 MG tablet, Take 120 mg by mouth daily before breakfast., Disp: , Rfl:   Allergies  Allergen Reactions  . Aleve [Naproxen Sodium] Swelling  . Naproxen Swelling   Review of Systems: Negative except as noted in the HPI. Denies N/V/F/Ch. Objective:   Vitals:   11/03/17 1128  BP: 136/76  Pulse: 82  Resp: 16  Temp: 98 F (36.7 C)   General AA&O x3. Normal mood and affect.  Vascular Dorsalis pedis and posterior tibial pulses  present 2+ bilaterally  Capillary refill normal to all digits. Pedal hair growth normal.  Neurologic Epicritic sensation grossly present.  Dermatologic No open lesions. Interspaces clear of maceration. Nails well groomed and normal in appearance.  Orthopedic: MMT 5/5 in dorsiflexion, plantarflexion, inversion, and eversion. Normal joint ROM without pain or crepitus. Pain palpation about the posterior calcaneus left   X-rays taken reviewed no acute fracture dislocations.  Posterior and plantar spurring noted Assessment & Plan:  Patient was evaluated and treated and all questions answered.  Plantar Fasciitis, left - XR reviewed as  above.  - Educated on icing and stretching. Instructions given.  - Injection delivered to the plantar fascia as below. - Night splint dispensed.  Procedure: Injection Tendon/Ligament Location: Left plantar fascia at the glabrous junction; medial approach. Skin Prep: Alcohol. Injectate: 1 cc 0.5% marcaine plain, 1 cc dexamethasone phosphate, 0.5 cc kenalog 10. Disposition: Patient tolerated procedure well. Injection site dressed with a band-aid.  No follow-ups on file.

## 2017-11-05 ENCOUNTER — Other Ambulatory Visit: Payer: Self-pay | Admitting: Podiatry

## 2017-11-05 DIAGNOSIS — M722 Plantar fascial fibromatosis: Secondary | ICD-10-CM

## 2017-11-05 DIAGNOSIS — M7662 Achilles tendinitis, left leg: Secondary | ICD-10-CM

## 2017-11-05 DIAGNOSIS — M216X9 Other acquired deformities of unspecified foot: Secondary | ICD-10-CM

## 2017-11-06 DIAGNOSIS — M5126 Other intervertebral disc displacement, lumbar region: Secondary | ICD-10-CM | POA: Diagnosis not present

## 2017-11-06 DIAGNOSIS — M47817 Spondylosis without myelopathy or radiculopathy, lumbosacral region: Secondary | ICD-10-CM | POA: Diagnosis not present

## 2017-11-25 ENCOUNTER — Ambulatory Visit: Payer: Commercial Managed Care - PPO | Admitting: Podiatry

## 2017-12-09 ENCOUNTER — Ambulatory Visit: Payer: Commercial Managed Care - PPO | Admitting: Podiatry

## 2017-12-09 DIAGNOSIS — M722 Plantar fascial fibromatosis: Secondary | ICD-10-CM | POA: Diagnosis not present

## 2017-12-09 NOTE — Progress Notes (Signed)
  Subjective:  Patient ID: Molly Patton, female    DOB: [birth year],  MRN: [id number]  Chief Complaint  Patient presents with  . Plantar Fasciitis    F/U L PF PT. stated," the pain is not as bad." Tx: stretching, ice bottle and NS    62 y.o. female presents with the above complaint. Doing better but still having pain. Night splint helps a lot but still having pain during the day.  Review of Systems: Negative except as noted in the HPI. Denies N/V/F/Ch.  No past medical history on file.  Current Outpatient Medications:  .  apixaban (ELIQUIS) 5 MG TABS tablet, Take 5 mg by mouth 2 (two) times daily., Disp: , Rfl:  .  Fluticasone-Salmeterol (ADVAIR) 250-50 MCG/DOSE AEPB, Inhale 1 puff into the lungs 2 (two) times daily., Disp: , Rfl:  .  lisinopril (PRINIVIL,ZESTRIL) 40 MG tablet, Take 40 mg by mouth daily., Disp: , Rfl:  .  omeprazole (PRILOSEC) 40 MG capsule, Take 40 mg by mouth daily., Disp: , Rfl:  .  thyroid (ARMOUR) 120 MG tablet, Take 120 mg by mouth daily before breakfast., Disp: , Rfl:   Social History   Tobacco Use  Smoking Status Never Smoker  Smokeless Tobacco Never Used    Allergies  Allergen Reactions  . Aleve [Naproxen Sodium] Swelling  . Naproxen Swelling   Objective:  There were no vitals filed for this visit. There is no height or weight on file to calculate BMI. Constitutional Well developed. Well nourished.  Vascular Dorsalis pedis pulses palpable bilaterally. Posterior tibial pulses palpable bilaterally. Capillary refill normal to all digits.  No cyanosis or clubbing noted. Pedal hair growth normal.  Neurologic Normal speech. Oriented to person, place, and time. Epicritic sensation to light touch grossly present bilaterally.  Dermatologic Nails well groomed and normal in appearance. No open wounds. No skin lesions.  Orthopedic: Normal joint ROM without pain or crepitus bilaterally. No visible deformities. Tender to palpation at the calcaneal  tuber left. No pain with calcaneal squeeze left. Ankle ROM diminished range of motion left. Silfverskiold Test: positive left.   Assessment:   1. Plantar fasciitis    Plan:  Patient was evaluated and treated and all questions answered.  Plantar Fasciitis, left - Continue stretching and icing. - Injection delivered to the plantar fascia as below. - DME: Plantar fascial brace dispensed. Continue night splint.  Procedure: Injection Tendon/Ligament Location: Left plantar fascia at the glabrous junction; medial approach. Skin Prep: alcohol Injectate: 1 cc 0.5% marcaine plain, 1 cc dexamethasone phosphate, 0.5 cc kenalog 10. Disposition: Patient tolerated procedure well. Injection site dressed with a band-aid.  Return if symptoms worsen or fail to improve.

## 2018-01-13 DIAGNOSIS — Z79899 Other long term (current) drug therapy: Secondary | ICD-10-CM | POA: Diagnosis not present

## 2018-01-13 DIAGNOSIS — E039 Hypothyroidism, unspecified: Secondary | ICD-10-CM | POA: Diagnosis not present

## 2018-01-13 DIAGNOSIS — I10 Essential (primary) hypertension: Secondary | ICD-10-CM | POA: Diagnosis not present

## 2018-01-13 DIAGNOSIS — E785 Hyperlipidemia, unspecified: Secondary | ICD-10-CM | POA: Diagnosis not present

## 2018-01-13 DIAGNOSIS — M1990 Unspecified osteoarthritis, unspecified site: Secondary | ICD-10-CM | POA: Diagnosis not present

## 2018-01-16 DIAGNOSIS — I480 Paroxysmal atrial fibrillation: Secondary | ICD-10-CM | POA: Diagnosis not present

## 2018-01-16 DIAGNOSIS — Z7901 Long term (current) use of anticoagulants: Secondary | ICD-10-CM | POA: Insufficient documentation

## 2018-01-16 DIAGNOSIS — I10 Essential (primary) hypertension: Secondary | ICD-10-CM | POA: Diagnosis not present

## 2018-01-16 DIAGNOSIS — J45909 Unspecified asthma, uncomplicated: Secondary | ICD-10-CM | POA: Diagnosis not present

## 2018-03-13 DIAGNOSIS — M1612 Unilateral primary osteoarthritis, left hip: Secondary | ICD-10-CM | POA: Diagnosis not present

## 2018-03-19 DIAGNOSIS — J019 Acute sinusitis, unspecified: Secondary | ICD-10-CM | POA: Diagnosis not present

## 2018-03-19 DIAGNOSIS — G8929 Other chronic pain: Secondary | ICD-10-CM | POA: Diagnosis not present

## 2018-03-19 DIAGNOSIS — Z6841 Body Mass Index (BMI) 40.0 and over, adult: Secondary | ICD-10-CM | POA: Diagnosis not present

## 2018-03-19 DIAGNOSIS — M170 Bilateral primary osteoarthritis of knee: Secondary | ICD-10-CM | POA: Diagnosis not present

## 2018-03-23 DIAGNOSIS — M25552 Pain in left hip: Secondary | ICD-10-CM | POA: Diagnosis not present

## 2018-03-23 DIAGNOSIS — M1612 Unilateral primary osteoarthritis, left hip: Secondary | ICD-10-CM | POA: Diagnosis not present

## 2018-04-17 DIAGNOSIS — E785 Hyperlipidemia, unspecified: Secondary | ICD-10-CM | POA: Diagnosis not present

## 2018-04-17 DIAGNOSIS — I10 Essential (primary) hypertension: Secondary | ICD-10-CM | POA: Diagnosis not present

## 2018-04-17 DIAGNOSIS — M1990 Unspecified osteoarthritis, unspecified site: Secondary | ICD-10-CM | POA: Diagnosis not present

## 2018-04-17 DIAGNOSIS — E039 Hypothyroidism, unspecified: Secondary | ICD-10-CM | POA: Diagnosis not present

## 2018-06-04 DIAGNOSIS — J019 Acute sinusitis, unspecified: Secondary | ICD-10-CM | POA: Diagnosis not present

## 2018-06-04 DIAGNOSIS — J208 Acute bronchitis due to other specified organisms: Secondary | ICD-10-CM | POA: Diagnosis not present

## 2018-06-04 DIAGNOSIS — Z6841 Body Mass Index (BMI) 40.0 and over, adult: Secondary | ICD-10-CM | POA: Diagnosis not present

## 2018-06-25 DIAGNOSIS — M25561 Pain in right knee: Secondary | ICD-10-CM | POA: Diagnosis not present

## 2018-06-25 DIAGNOSIS — M25562 Pain in left knee: Secondary | ICD-10-CM | POA: Diagnosis not present

## 2018-06-25 DIAGNOSIS — G8929 Other chronic pain: Secondary | ICD-10-CM | POA: Diagnosis not present

## 2018-09-30 DIAGNOSIS — I10 Essential (primary) hypertension: Secondary | ICD-10-CM | POA: Diagnosis not present

## 2018-09-30 DIAGNOSIS — G4733 Obstructive sleep apnea (adult) (pediatric): Secondary | ICD-10-CM | POA: Diagnosis not present

## 2018-09-30 DIAGNOSIS — J4520 Mild intermittent asthma, uncomplicated: Secondary | ICD-10-CM | POA: Diagnosis not present

## 2018-09-30 DIAGNOSIS — I480 Paroxysmal atrial fibrillation: Secondary | ICD-10-CM | POA: Diagnosis not present

## 2018-11-02 DIAGNOSIS — H6123 Impacted cerumen, bilateral: Secondary | ICD-10-CM | POA: Diagnosis not present

## 2018-11-02 DIAGNOSIS — Z9622 Myringotomy tube(s) status: Secondary | ICD-10-CM | POA: Diagnosis not present

## 2018-11-02 DIAGNOSIS — H7291 Unspecified perforation of tympanic membrane, right ear: Secondary | ICD-10-CM | POA: Diagnosis not present

## 2018-11-05 DIAGNOSIS — M545 Low back pain: Secondary | ICD-10-CM | POA: Diagnosis not present

## 2018-11-05 DIAGNOSIS — G8929 Other chronic pain: Secondary | ICD-10-CM | POA: Diagnosis not present

## 2018-11-05 DIAGNOSIS — M25561 Pain in right knee: Secondary | ICD-10-CM | POA: Diagnosis not present

## 2018-11-05 DIAGNOSIS — M7918 Myalgia, other site: Secondary | ICD-10-CM | POA: Diagnosis not present

## 2018-11-05 DIAGNOSIS — M25562 Pain in left knee: Secondary | ICD-10-CM | POA: Diagnosis not present

## 2018-11-05 DIAGNOSIS — M549 Dorsalgia, unspecified: Secondary | ICD-10-CM | POA: Diagnosis not present

## 2018-11-05 DIAGNOSIS — M5136 Other intervertebral disc degeneration, lumbar region: Secondary | ICD-10-CM | POA: Diagnosis not present

## 2018-11-12 DIAGNOSIS — E1165 Type 2 diabetes mellitus with hyperglycemia: Secondary | ICD-10-CM | POA: Diagnosis not present

## 2018-11-12 DIAGNOSIS — E785 Hyperlipidemia, unspecified: Secondary | ICD-10-CM | POA: Diagnosis not present

## 2018-11-12 DIAGNOSIS — I10 Essential (primary) hypertension: Secondary | ICD-10-CM | POA: Diagnosis not present

## 2018-11-12 DIAGNOSIS — E039 Hypothyroidism, unspecified: Secondary | ICD-10-CM | POA: Diagnosis not present

## 2018-11-12 DIAGNOSIS — M1990 Unspecified osteoarthritis, unspecified site: Secondary | ICD-10-CM | POA: Diagnosis not present

## 2018-12-17 DIAGNOSIS — M5136 Other intervertebral disc degeneration, lumbar region: Secondary | ICD-10-CM | POA: Diagnosis not present

## 2018-12-17 DIAGNOSIS — Z6841 Body Mass Index (BMI) 40.0 and over, adult: Secondary | ICD-10-CM | POA: Diagnosis not present

## 2018-12-17 DIAGNOSIS — M545 Low back pain: Secondary | ICD-10-CM | POA: Diagnosis not present

## 2018-12-17 DIAGNOSIS — M791 Myalgia, unspecified site: Secondary | ICD-10-CM | POA: Diagnosis not present

## 2018-12-23 DIAGNOSIS — M25561 Pain in right knee: Secondary | ICD-10-CM | POA: Diagnosis not present

## 2018-12-23 DIAGNOSIS — M25562 Pain in left knee: Secondary | ICD-10-CM | POA: Diagnosis not present

## 2018-12-23 DIAGNOSIS — M170 Bilateral primary osteoarthritis of knee: Secondary | ICD-10-CM | POA: Diagnosis not present

## 2018-12-23 DIAGNOSIS — M25761 Osteophyte, right knee: Secondary | ICD-10-CM | POA: Diagnosis not present

## 2018-12-30 DIAGNOSIS — M25762 Osteophyte, left knee: Secondary | ICD-10-CM | POA: Diagnosis not present

## 2018-12-30 DIAGNOSIS — M25562 Pain in left knee: Secondary | ICD-10-CM | POA: Diagnosis not present

## 2018-12-30 DIAGNOSIS — M1712 Unilateral primary osteoarthritis, left knee: Secondary | ICD-10-CM | POA: Diagnosis not present

## 2018-12-31 DIAGNOSIS — M222X1 Patellofemoral disorders, right knee: Secondary | ICD-10-CM | POA: Diagnosis not present

## 2018-12-31 DIAGNOSIS — M1711 Unilateral primary osteoarthritis, right knee: Secondary | ICD-10-CM | POA: Diagnosis not present

## 2018-12-31 DIAGNOSIS — M25511 Pain in right shoulder: Secondary | ICD-10-CM | POA: Diagnosis not present

## 2018-12-31 DIAGNOSIS — M25761 Osteophyte, right knee: Secondary | ICD-10-CM | POA: Diagnosis not present

## 2019-02-09 DIAGNOSIS — M25561 Pain in right knee: Secondary | ICD-10-CM | POA: Diagnosis not present

## 2019-02-09 DIAGNOSIS — G8929 Other chronic pain: Secondary | ICD-10-CM | POA: Diagnosis not present

## 2019-02-09 DIAGNOSIS — M25562 Pain in left knee: Secondary | ICD-10-CM | POA: Diagnosis not present

## 2019-02-12 DIAGNOSIS — I10 Essential (primary) hypertension: Secondary | ICD-10-CM | POA: Diagnosis not present

## 2019-02-12 DIAGNOSIS — E039 Hypothyroidism, unspecified: Secondary | ICD-10-CM | POA: Diagnosis not present

## 2019-02-12 DIAGNOSIS — E1165 Type 2 diabetes mellitus with hyperglycemia: Secondary | ICD-10-CM | POA: Diagnosis not present

## 2019-02-12 DIAGNOSIS — Z1331 Encounter for screening for depression: Secondary | ICD-10-CM | POA: Diagnosis not present

## 2019-02-12 DIAGNOSIS — M1990 Unspecified osteoarthritis, unspecified site: Secondary | ICD-10-CM | POA: Diagnosis not present

## 2019-02-27 DIAGNOSIS — Z1231 Encounter for screening mammogram for malignant neoplasm of breast: Secondary | ICD-10-CM | POA: Diagnosis not present

## 2019-03-17 DIAGNOSIS — M9902 Segmental and somatic dysfunction of thoracic region: Secondary | ICD-10-CM | POA: Diagnosis not present

## 2019-03-17 DIAGNOSIS — M9905 Segmental and somatic dysfunction of pelvic region: Secondary | ICD-10-CM | POA: Diagnosis not present

## 2019-03-17 DIAGNOSIS — M9903 Segmental and somatic dysfunction of lumbar region: Secondary | ICD-10-CM | POA: Diagnosis not present

## 2019-03-17 DIAGNOSIS — M545 Low back pain: Secondary | ICD-10-CM | POA: Diagnosis not present

## 2019-03-22 DIAGNOSIS — M9905 Segmental and somatic dysfunction of pelvic region: Secondary | ICD-10-CM | POA: Diagnosis not present

## 2019-03-22 DIAGNOSIS — I10 Essential (primary) hypertension: Secondary | ICD-10-CM | POA: Diagnosis not present

## 2019-03-22 DIAGNOSIS — J019 Acute sinusitis, unspecified: Secondary | ICD-10-CM | POA: Diagnosis not present

## 2019-03-22 DIAGNOSIS — Z6841 Body Mass Index (BMI) 40.0 and over, adult: Secondary | ICD-10-CM | POA: Diagnosis not present

## 2019-03-22 DIAGNOSIS — Z79899 Other long term (current) drug therapy: Secondary | ICD-10-CM | POA: Diagnosis not present

## 2019-03-22 DIAGNOSIS — M9903 Segmental and somatic dysfunction of lumbar region: Secondary | ICD-10-CM | POA: Diagnosis not present

## 2019-03-22 DIAGNOSIS — B9689 Other specified bacterial agents as the cause of diseases classified elsewhere: Secondary | ICD-10-CM | POA: Diagnosis not present

## 2019-03-22 DIAGNOSIS — M545 Low back pain: Secondary | ICD-10-CM | POA: Diagnosis not present

## 2019-03-22 DIAGNOSIS — M9902 Segmental and somatic dysfunction of thoracic region: Secondary | ICD-10-CM | POA: Diagnosis not present

## 2019-04-22 DIAGNOSIS — S82811D Torus fracture of upper end of right fibula, subsequent encounter for fracture with routine healing: Secondary | ICD-10-CM | POA: Insufficient documentation

## 2019-07-08 DIAGNOSIS — Z20822 Contact with and (suspected) exposure to covid-19: Secondary | ICD-10-CM | POA: Diagnosis not present

## 2019-08-16 ENCOUNTER — Other Ambulatory Visit: Payer: Self-pay

## 2019-08-17 ENCOUNTER — Ambulatory Visit (INDEPENDENT_AMBULATORY_CARE_PROVIDER_SITE_OTHER): Payer: Medicare Other | Admitting: Podiatry

## 2019-08-17 ENCOUNTER — Other Ambulatory Visit: Payer: Self-pay

## 2019-08-17 ENCOUNTER — Other Ambulatory Visit: Payer: Self-pay | Admitting: Podiatry

## 2019-08-17 ENCOUNTER — Ambulatory Visit (INDEPENDENT_AMBULATORY_CARE_PROVIDER_SITE_OTHER): Payer: Medicare Other

## 2019-08-17 DIAGNOSIS — M19079 Primary osteoarthritis, unspecified ankle and foot: Secondary | ICD-10-CM

## 2019-08-17 DIAGNOSIS — M79671 Pain in right foot: Secondary | ICD-10-CM

## 2019-08-17 DIAGNOSIS — M19071 Primary osteoarthritis, right ankle and foot: Secondary | ICD-10-CM

## 2019-08-17 DIAGNOSIS — M19072 Primary osteoarthritis, left ankle and foot: Secondary | ICD-10-CM

## 2019-08-27 DIAGNOSIS — J4540 Moderate persistent asthma, uncomplicated: Secondary | ICD-10-CM | POA: Diagnosis not present

## 2019-08-27 DIAGNOSIS — E1165 Type 2 diabetes mellitus with hyperglycemia: Secondary | ICD-10-CM | POA: Diagnosis not present

## 2019-08-27 DIAGNOSIS — I10 Essential (primary) hypertension: Secondary | ICD-10-CM | POA: Diagnosis not present

## 2019-08-27 DIAGNOSIS — E785 Hyperlipidemia, unspecified: Secondary | ICD-10-CM | POA: Diagnosis not present

## 2019-08-27 DIAGNOSIS — Z6841 Body Mass Index (BMI) 40.0 and over, adult: Secondary | ICD-10-CM | POA: Diagnosis not present

## 2019-08-27 DIAGNOSIS — E039 Hypothyroidism, unspecified: Secondary | ICD-10-CM | POA: Diagnosis not present

## 2019-08-30 ENCOUNTER — Encounter: Payer: Self-pay | Admitting: Gastroenterology

## 2019-09-02 DIAGNOSIS — M25561 Pain in right knee: Secondary | ICD-10-CM | POA: Diagnosis not present

## 2019-09-02 DIAGNOSIS — G8929 Other chronic pain: Secondary | ICD-10-CM | POA: Diagnosis not present

## 2019-09-02 DIAGNOSIS — M25562 Pain in left knee: Secondary | ICD-10-CM | POA: Diagnosis not present

## 2019-09-14 ENCOUNTER — Other Ambulatory Visit: Payer: Self-pay

## 2019-09-14 ENCOUNTER — Ambulatory Visit (INDEPENDENT_AMBULATORY_CARE_PROVIDER_SITE_OTHER): Payer: Medicare Other | Admitting: Podiatry

## 2019-09-14 ENCOUNTER — Ambulatory Visit (INDEPENDENT_AMBULATORY_CARE_PROVIDER_SITE_OTHER): Payer: Medicare Other

## 2019-09-14 DIAGNOSIS — M19071 Primary osteoarthritis, right ankle and foot: Secondary | ICD-10-CM

## 2019-09-14 DIAGNOSIS — M19072 Primary osteoarthritis, left ankle and foot: Secondary | ICD-10-CM | POA: Diagnosis not present

## 2019-09-14 DIAGNOSIS — M19079 Primary osteoarthritis, unspecified ankle and foot: Secondary | ICD-10-CM

## 2019-09-14 NOTE — Progress Notes (Signed)
  Subjective:  Patient ID: Molly Patton, female    DOB: [birth year],  MRN: [id number]  Chief Complaint  Patient presents with  . Foot Pain    F?U Rt foot pain and knot at dorsal foot Pt. states," pain at top of foot is the same. Worse with shoes; 10/10 shapr pains Tx: vinegar soaking  . Cyst    pt co new knot at Lt dorsal right below dorsal ankle x few wks; 10/10 thorbbing with shoes Tx: vinegar soaking    64 y.o. female presents with the above complaint. History confirmed with patient.   Objective:  Physical Exam: warm, good capillary refill, no trophic changes or ulcerative lesions, normal DP and PT pulses and normal sensory exam. Left Foot: POP dorsal midfoot with osseous prominence  Right Foot: POP dorsal midfoot with osseous prominence   No images are attached to the encounter.  Radiographs: X-ray of the left foot: degenerative changes of the midtarsal joint Assessment:   1. Arthritis of midfoot    Plan:  Patient was evaluated and treated and all questions answered.  Midfoot arthritis -Repeat injection bilat  Procedure: Joint Injection Location: Bilateral 2nd/3rd TMT joint Skin Prep: Alcohol. Injectate: 0.5 cc 1% lidocaine plain, 0.5 cc dexamethasone phosphate. Disposition: Patient tolerated procedure well. Injection site dressed with a band-aid.  No follow-ups on file.

## 2019-09-14 NOTE — Progress Notes (Signed)
  Subjective:  Patient ID: Molly Patton, female    DOB: 10-29-1955,  MRN: [id number]  Chief Complaint  Patient presents with  . Foot Pain    pain at Rt 2nd-3rd metatarsals and knot at dorsal foot x Nov; 10/10 shapr pains when walking -pt dnies injury/swelling -hurts to bend tx: pain patches  . Diabetes    FBS: 122 x Sun A1C; na PCP; Moon x 2 mo   64 y.o. female presents with the above complaint. History confirmed with patient.   Objective:  Physical Exam: warm, good capillary refill, no trophic changes or ulcerative lesions, normal DP and PT pulses and normal sensory exam. POP dorsal 2nd TMT bilat  No images are attached to the encounter.  Radiographs: X-ray of the right foot: degenerative changes of the midtarsal joint Assessment:   1. Arthritis of midfoot    Plan:  Patient was evaluated and treated and all questions answered.  Midfoot arthritis -Discussed padding and proper shoegear -Injection delivered to the painful joint  Procedure: Joint Injection Location: Right 2nd/3rd TMT joint Skin Prep: Alcohol. Injectate: 0.5 cc 1% lidocaine plain, 0.5 cc dexamethasone phosphate. Disposition: Patient tolerated procedure well. Injection site dressed with a band-aid.  Return in about 3 weeks (around 09/07/2019).

## 2019-10-05 ENCOUNTER — Other Ambulatory Visit: Payer: Self-pay

## 2019-10-05 ENCOUNTER — Ambulatory Visit (INDEPENDENT_AMBULATORY_CARE_PROVIDER_SITE_OTHER): Payer: Medicare Other | Admitting: Podiatry

## 2019-10-05 DIAGNOSIS — M19071 Primary osteoarthritis, right ankle and foot: Secondary | ICD-10-CM

## 2019-10-05 DIAGNOSIS — M19072 Primary osteoarthritis, left ankle and foot: Secondary | ICD-10-CM

## 2019-10-05 DIAGNOSIS — M19079 Primary osteoarthritis, unspecified ankle and foot: Secondary | ICD-10-CM

## 2019-10-05 NOTE — Progress Notes (Signed)
  Subjective:  Patient ID: Molly Patton, female    DOB: [birth year],  MRN: [id number]  Chief Complaint  Patient presents with  . Arthritis    F?U BL arthritis Pt. states," it's better, but I be having a hard tiem w/ any shoes that applies any pressure on my feet." Tx: pain patches and epsom salt    64 y.o. female presents with the above complaint. History confirmed with patient.   Objective:  Physical Exam: warm, good capillary refill, no trophic changes or ulcerative lesions, normal DP and PT pulses and normal sensory exam. Left Foot: POP dorsal midfoot with osseous prominence  Right Foot: POP dorsal midfoot with osseous prominence   No images are attached to the encounter.  Radiographs: X-ray of the left foot: degenerative changes of the midtarsal joint Assessment:   1. Arthritis of midfoot    Plan:  Patient was evaluated and treated and all questions answered.  Midfoot arthritis -Final injections bilat  Procedure: Joint Injection Location: Bilateral dorsal 2nd TMT joint Skin Prep: Alcohol. Injectate: 0.5 cc 1% lidocaine plain, 0.5 cc dexamethasone phosphate. Disposition: Patient tolerated procedure well. Injection site dressed with a band-aid.  No follow-ups on file.

## 2019-10-07 ENCOUNTER — Telehealth: Payer: Self-pay

## 2019-10-07 ENCOUNTER — Ambulatory Visit (INDEPENDENT_AMBULATORY_CARE_PROVIDER_SITE_OTHER): Payer: Medicare Other | Admitting: Gastroenterology

## 2019-10-07 ENCOUNTER — Encounter: Payer: Self-pay | Admitting: Gastroenterology

## 2019-10-07 VITALS — BP 112/74 | HR 80 | Temp 97.8°F | Ht 65.0 in | Wt 282.4 lb

## 2019-10-07 DIAGNOSIS — R197 Diarrhea, unspecified: Secondary | ICD-10-CM | POA: Diagnosis not present

## 2019-10-07 DIAGNOSIS — K625 Hemorrhage of anus and rectum: Secondary | ICD-10-CM | POA: Diagnosis not present

## 2019-10-07 DIAGNOSIS — Z86010 Personal history of colonic polyps: Secondary | ICD-10-CM

## 2019-10-07 NOTE — Patient Instructions (Addendum)
If you are age 65 or older, your body mass index should be between 23-30. Your Body mass index is 46.99 kg/m. If this is out of the aforementioned range listed, please consider follow up with your Primary Care Provider.  If you are age 66 or younger, your body mass index should be between 19-25. Your Body mass index is 46.99 kg/m. If this is out of the aformentioned range listed, please consider follow up with your Primary Care Provider.   You have been scheduled for a colonoscopy. Please follow written instructions given to you at your visit today.  Please pick up your prep supplies at the pharmacy within the next 1-3 days. If you use inhalers (even only as needed), please bring them with you on the day of your procedure. Your physician has requested that you go to www.startemmi.com and enter the access code given to you at your visit today. This web site gives a general overview about your procedure. However, you should still follow specific instructions given to you by our office regarding your preparation for the procedure.  You will be contacted by our office prior to your procedure for directions on holding your Eliquis.  If you do not hear from our office 1 week prior to your scheduled procedure, please call [phone] to discuss.     Thank you for choosing me and [facility].  Jackquline Denmark, MD

## 2019-10-07 NOTE — Progress Notes (Signed)
Chief Complaint: for colon  Referring Provider:  Lowella Dandy, NP      ASSESSMENT AND PLAN;   #1.  Diarrhea with occasional rectal bleeding.  Diarrhea could be d/t IBS/meds like metformin. Nl Hb at [facility] office.  #2.  H/O colonic polyps  #3.  Comorbid conditions include A. fib on Eliquis, DM2, HLD, HTN, OSA on CPAP  Plan: -Colonoscopy after cardiology clearance (Dr Ola Spurr) to hold Eliquis x 48 hrs before. -Discussed risks and benefits.   HPI:    ANIELLA Patton is a 64 y.o. female  With H/O A. fib on Eliquis, DM2, HLD, HTN, OSA on CPAP, anxiety  With intermittent diarrhea  4/day (only when she takes metformin) and at times some blood in the stool.  Diarrhea more prominent ever since she has been on Metformin.  It does not occur when she takes Metformin at night.  She denies having any abdominal pain but does complain of abdominal bloating.  Rectal bleeding which she has noticed is mostly on the tissue paper and is bright red in color.  She had normal CBC recently per patient at [facility] office.  Has intentionally lost over 30 pounds in the last 6 months.  Denies having any upper GI symptoms including heartburn, nausea, vomiting, odynophagia or dysphagia.  No fever chills or night sweats.  Being closely followed by Dr. Ola Spurr from cardiology.   Past GI procedures: -Colonoscopy 09/2014 ([facility]) significant colonic polyps s/p polypectomy, mild sigmoid diverticulosis.  Recommended to repeat in 3 years.  History of colonic polyps 2009  SH-widowed, has 2 boys and 1 girl. Past Medical History:  Diagnosis Date  . Anxiety   . Arrhythmia   . Asthma   . Atrial fibrillation ([place])   . Depression   . Diabetes ([place])   . Elevated cholesterol   . History of colon polyps   . Hypertension   . Obesity   . Sleep apnea    On CPAP Machine   . Thyroid disease     Past Surgical History:  Procedure Laterality Date  . CARDIAC ELECTROPHYSIOLOGY MAPPING AND ABLATION      Family  History  Problem Relation Age of Onset  . Prostate cancer Father   . Diabetes Father   . Colon cancer Cousin        1st cousin   . Colon polyps Sister   . Diabetes Sister   . Irritable bowel syndrome Sister   . Diabetes Brother   . Congestive Heart Failure Mother   . Esophageal cancer Neg Hx     Social History   Tobacco Use  . Smoking status: Former Research scientist (life sciences)  . Smokeless tobacco: Never Used  . Tobacco comment: quit 30's years ago  Vaping Use  . Vaping Use: Never used  Substance Use Topics  . Alcohol use: Yes    Comment: ocassionally   . Drug use: Never    Current Outpatient Medications  Medication Sig Dispense Refill  . apixaban (ELIQUIS) 5 MG TABS tablet Take 5 mg by mouth 2 (two) times daily.    . CONTOUR NEXT TEST test strip 1 each daily.    . diazepam (VALIUM) 10 MG tablet Take 10 mg by mouth as needed.     Marland Kitchen lisinopril (PRINIVIL,ZESTRIL) 40 MG tablet Take 40 mg by mouth daily.    . metFORMIN (GLUCOPHAGE) 1000 MG tablet Take 500 mg by mouth daily.    . mometasone-formoterol (DULERA) 200-5 MCG/ACT AERO Inhale 2 puffs into the lungs  daily. Every Morning    . montelukast (SINGULAIR) 10 MG tablet Take 10 mg by mouth daily.    Marland Kitchen omeprazole (PRILOSEC) 40 MG capsule Take 40 mg by mouth daily.    Marland Kitchen oxyCODONE-acetaminophen (PERCOCET/ROXICET) 5-325 MG tablet Take 1 tablet by mouth as needed.     . pravastatin (PRAVACHOL) 10 MG tablet Take 10 mg by mouth daily.    Marland Kitchen thyroid (ARMOUR) 120 MG tablet Take 120 mg by mouth daily before breakfast.     No current facility-administered medications for this visit.    Allergies  Allergen Reactions  . Aleve [Naproxen Sodium] Swelling  . Naproxen Swelling    Review of Systems:  Constitutional: Denies fever, chills, diaphoresis, appetite change and has fatigue.  HEENT: Has allergies Respiratory: Denies SOB, DOE, cough, chest tightness,  and wheezing.   Cardiovascular: Denies chest pain, palpitations and leg swelling.  Genitourinary:  Denies dysuria, urgency, frequency, hematuria, flank pain and difficulty urinating.  Musculoskeletal: Has myalgias, back pain, joint swelling, arthralgias and gait problem.  Skin: No rash.  Neurological: Denies dizziness, seizures, syncope, weakness, light-headedness, numbness and headaches.  Hematological: Denies adenopathy. Easy bruising, personal or family bleeding history  Psychiatric/Behavioral: Has anxiety or depression     Physical Exam:    BP 112/74   Pulse 80   Temp 97.8 F (36.6 C)   Ht 5\' 5"  (1.651 m)   Wt 282 lb 6 oz (128.1 kg)   BMI 46.99 kg/m  Wt Readings from Last 3 Encounters:  10/07/19 282 lb 6 oz (128.1 kg)  11/03/17 297 lb (134.7 kg)   Constitutional:  Well-developed, in no acute distress. Psychiatric: Normal mood and affect. Behavior is normal. HEENT: Pupils normal.  Conjunctivae are normal. No scleral icterus. Neck supple.  Cardiovascular: Normal rate, regular rhythm. No edema Pulmonary/chest: Effort normal and breath sounds normal. No wheezing, rales or rhonchi. Abdominal: Soft, nondistended. Nontender. Bowel sounds active throughout. There are no masses palpable. No hepatomegaly. Rectal: To be done at the time of colonoscopy Neurological: Alert and oriented to person place and time. Skin: Skin is warm and dry. No rashes noted.  Data Reviewed: I have personally reviewed following labs and imaging studies  CBC: CBC Latest Ref Rng & Units 04/07/2008  WBC 4.0 - 10.5 K/uL 8.4  Hemoglobin 12.0 - 15.0 g/dL 13.5  Hematocrit 36 - 46 % 41.1  Platelets 150 - 400 K/uL 303    CMP: CMP Latest Ref Rng & Units 04/07/2008  Glucose 70 - 99 mg/dL 107(H)  BUN 6 - 23 mg/dL 12  Creatinine 0.40 - 1.20 mg/dL 0.81  Sodium 135 - 145 mEq/L 138  Potassium 3.5 - 5.1 mEq/L 4.6  Chloride 96 - 112 mEq/L 104  CO2 19 - 32 mEq/L 25  Calcium 8.4 - 10.5 mg/dL 8.9        Carmell Austria, MD 10/07/2019, 9:01 AM  Cc: Lowella Dandy, NP

## 2019-10-07 NOTE — Telephone Encounter (Signed)
[facility] [address] [place], [place]  [address] Phone:  [phone]   Fax:  [phone]   10/07/2019   RE:      Molly Patton DOB:   Mar 21, 1956 MRN:   [id number]   Dear Dr. Ola Spurr,    We have scheduled the above patient for an endoscopic procedure. Our records show that she is on anticoagulation therapy.   Please advise as to whether the patient may come off her therapy of Eliquis two days prior to the colonoscopy procedure, which is scheduled for 10/25/19.  Please fax back to [place], [place] at [phone].   Sincerely,    Francesco Sor

## 2019-10-13 NOTE — Telephone Encounter (Signed)
Received clearance and patient was notified to stop Eliquis 2 days prior to procedure and verbalized understanding

## 2019-10-25 ENCOUNTER — Encounter: Payer: Self-pay | Admitting: Gastroenterology

## 2019-10-25 ENCOUNTER — Ambulatory Visit (AMBULATORY_SURGERY_CENTER): Payer: Medicare Other | Admitting: Gastroenterology

## 2019-10-25 ENCOUNTER — Other Ambulatory Visit: Payer: Self-pay

## 2019-10-25 VITALS — BP 133/72 | HR 67 | Temp 96.8°F | Resp 15 | Ht 65.0 in | Wt 282.0 lb

## 2019-10-25 DIAGNOSIS — K649 Unspecified hemorrhoids: Secondary | ICD-10-CM

## 2019-10-25 DIAGNOSIS — D122 Benign neoplasm of ascending colon: Secondary | ICD-10-CM | POA: Diagnosis not present

## 2019-10-25 DIAGNOSIS — D123 Benign neoplasm of transverse colon: Secondary | ICD-10-CM

## 2019-10-25 DIAGNOSIS — K621 Rectal polyp: Secondary | ICD-10-CM

## 2019-10-25 DIAGNOSIS — K5730 Diverticulosis of large intestine without perforation or abscess without bleeding: Secondary | ICD-10-CM | POA: Diagnosis not present

## 2019-10-25 DIAGNOSIS — D120 Benign neoplasm of cecum: Secondary | ICD-10-CM | POA: Diagnosis not present

## 2019-10-25 DIAGNOSIS — R197 Diarrhea, unspecified: Secondary | ICD-10-CM | POA: Diagnosis not present

## 2019-10-25 DIAGNOSIS — Z86010 Personal history of colonic polyps: Secondary | ICD-10-CM

## 2019-10-25 DIAGNOSIS — K635 Polyp of colon: Secondary | ICD-10-CM | POA: Diagnosis not present

## 2019-10-25 DIAGNOSIS — D128 Benign neoplasm of rectum: Secondary | ICD-10-CM

## 2019-10-25 MED ORDER — SODIUM CHLORIDE 0.9 % IV SOLN: 500.0000 mL | Freq: Once | INTRAVENOUS | Status: DC

## 2019-10-25 NOTE — Progress Notes (Signed)
Called to room to assist during endoscopic procedure.  Patient ID and intended procedure confirmed with present staff. Received instructions for my participation in the procedure from the performing physician.  

## 2019-10-25 NOTE — Progress Notes (Signed)
pt tolerated well. VSS. awake and to recovery. Report given to RN.  

## 2019-10-25 NOTE — Progress Notes (Signed)
Pt's states no medical or surgical changes since previsit or office visit. 

## 2019-10-25 NOTE — Op Note (Signed)
[facility] Patient Name: Molly Patton Procedure Date: 10/25/2019 8:37 AM MRN: [id number] Endoscopist: Jackquline Denmark , MD Age: 64 Referring MD:  Date of Birth: 03-10-1956 Gender: Female Account #: [id number] Procedure:                Colonoscopy Indications:              Clinically significant diarrhea of unexplained                            origin. H/o rectal bleeding. H/O colonic polyps. Medicines:                Fentanyl 70 micrograms IV, Monitored Anesthesia Care Procedure:                Pre-Anesthesia Assessment:                           - Prior to the procedure, a History and Physical                            was performed, and patient medications and                            allergies were reviewed. The patient's tolerance of                            previous anesthesia was also reviewed. The risks                            and benefits of the procedure and the sedation                            options and risks were discussed with the patient.                            All questions were answered, and informed consent                            was obtained. Prior Anticoagulants: The patient has                            taken Eliquis (apixaban), last dose was 2 days                            prior to procedure. ASA Grade Assessment: III - A                            patient with severe systemic disease. After                            reviewing the risks and benefits, the patient was                            deemed in satisfactory condition to undergo the  procedure.                           After obtaining informed consent, the colonoscope                            was passed under direct vision. Throughout the                            procedure, the patient's blood pressure, pulse, and                            oxygen saturations were monitored continuously. The                            [id number] was introduced  through the anus and                            advanced to the 2 cm into the ileum. The                            colonoscopy was performed without difficulty. The                            patient tolerated the procedure well. The quality                            of the bowel preparation was good. The terminal                            ileum, ileocecal valve, appendiceal orifice, and                            rectum were photographed. Scope In: 8:52:02 AM Scope Out: 9:18:32 AM Scope Withdrawal Time: 0 hours 23 minutes 31 seconds  Total Procedure Duration: 0 hours 26 minutes 30 seconds  Findings:                 Two sessile polyps were found in the proximal                            ascending colon and cecum. The polyps were 6 to 8                            mm in size. These polyps were removed with a cold                            snare. Resection and retrieval were complete.                           A 12 mm polyp was found in the mid ascending colon.                            The polyp was sessile. The polyp  was removed with a                            hot snare. Resection and retrieval were complete.                           A 10 mm polyp was found in the mid transverse                            colon. The polyp was sessile. The polyp was removed                            with a hot snare. Resection and retrieval were                            complete.                           A 6 mm polyp was found in the rectum. The polyp was                            sessile. The polyp was removed with a cold snare.                            Resection and retrieval were complete.                           A few small-mouthed diverticula were found in the                            sigmoid colon.                           Non-bleeding internal hemorrhoids were found during                            retroflexion. The hemorrhoids were small.                           The colonic  mucosa (entire examined portion)                            appeared normal with well preserved vascular                            pattern. Biopsies for histology were taken with a                            cold forceps from the entire colon for evaluation                            of microscopic colitis.                           The terminal  ileum appeared normal.                           The exam was otherwise without abnormality on                            direct and retroflexion views. Complications:            No immediate complications. Estimated Blood Loss:     Estimated blood loss: none. Impression:               -Colonic polyps s/p polypectomy.                           -Mild sigmoid diverticulosis.                           -Non-bleeding internal hemorrhoids. Recommendation:           - Patient has a contact number available for                            emergencies. The signs and symptoms of potential                            delayed complications were discussed with the                            patient. Return to normal activities tomorrow.                            Written discharge instructions were provided to the                            patient.                           - Resume previous diet.                           - Resume Eliquis (apixaban) at prior dose in 3 days.                           - Await pathology results.                           - Repeat colonoscopy for surveillance based on                            pathology results.                           - The findings and recommendations were discussed                            with Tammy.                           - FU in  GI clinic in 6 to 8 weeks. Jackquline Denmark, MD 10/25/2019 9:28:08 AM This report has been signed electronically.

## 2019-10-25 NOTE — Patient Instructions (Signed)
Impression/Recommendations:  Polyp handout given to patient. Diverticulosis handout given to patient. Hemorrhoid handout given to patient.  Resume previous diet. Resume Eliquis at prior dose in 3 days.  Await pathology results.  Repeat colonoscopy for surveillance.  Date to be determined after pathology results reviewed.  Follow-up in GI clinic in 6-8 weeks.  YOU HAD AN ENDOSCOPIC PROCEDURE TODAY AT [facility]:   Refer to the procedure report that was given to you for any specific questions about what was found during the examination.  If the procedure report does not answer your questions, please call your gastroenterologist to clarify.  If you requested that your care partner not be given the details of your procedure findings, then the procedure report has been included in a sealed envelope for you to review at your convenience later.  YOU SHOULD EXPECT: Some feelings of bloating in the abdomen. Passage of more gas than usual.  Walking can help get rid of the air that was put into your GI tract during the procedure and reduce the bloating. If you had a lower endoscopy (such as a colonoscopy or flexible sigmoidoscopy) you may notice spotting of blood in your stool or on the toilet paper. If you underwent a bowel prep for your procedure, you may not have a normal bowel movement for a few days.  Please Note:  You might notice some irritation and congestion in your nose or some drainage.  This is from the oxygen used during your procedure.  There is no need for concern and it should clear up in a day or so.  SYMPTOMS TO REPORT IMMEDIATELY:   Following lower endoscopy (colonoscopy or flexible sigmoidoscopy):  Excessive amounts of blood in the stool  Significant tenderness or worsening of abdominal pains  Swelling of the abdomen that is new, acute  Fever of 100F or higher  For urgent or emergent issues, a gastroenterologist can be reached at any hour by calling [phone]  [phone]. Do not use MyChart messaging for urgent concerns.    DIET:  We do recommend a small meal at first, but then you may proceed to your regular diet.  Drink plenty of fluids but you should avoid alcoholic beverages for 24 hours.  ACTIVITY:  You should plan to take it easy for the rest of today and you should NOT DRIVE or use heavy machinery until tomorrow (because of the sedation medicines used during the test).    FOLLOW UP: Our staff will call the number listed on your records 48-72 hours following your procedure to check on you and address any questions or concerns that you may have regarding the information given to you following your procedure. If we do not reach you, we will leave a message.  We will attempt to reach you two times.  During this call, we will ask if you have developed any symptoms of COVID 19. If you develop any symptoms (ie: fever, flu-like symptoms, shortness of breath, cough etc.) before then, please call [phone].  If you test positive for Covid 19 in the 2 weeks post procedure, please call and report this information to [place].    If any biopsies were taken you will be contacted by phone or by letter within the next 1-3 weeks.  Please call us at [phone] if you have not heard about the biopsies in 3 weeks.    SIGNATURES/CONFIDENTIALITY: You and/or your care partner have signed paperwork which will be entered into your electronic medical record.  These  signatures attest to the fact that that the information above on your After Visit Summary has been reviewed and is understood.  Full responsibility of the confidentiality of this discharge information lies with you and/or your care-partner.

## 2019-10-26 ENCOUNTER — Other Ambulatory Visit: Payer: Self-pay | Admitting: Podiatry

## 2019-10-26 DIAGNOSIS — M79605 Pain in left leg: Secondary | ICD-10-CM | POA: Diagnosis not present

## 2019-10-26 DIAGNOSIS — G8929 Other chronic pain: Secondary | ICD-10-CM | POA: Diagnosis not present

## 2019-10-26 DIAGNOSIS — M1990 Unspecified osteoarthritis, unspecified site: Secondary | ICD-10-CM | POA: Diagnosis not present

## 2019-10-26 DIAGNOSIS — E039 Hypothyroidism, unspecified: Secondary | ICD-10-CM | POA: Diagnosis not present

## 2019-10-26 DIAGNOSIS — M19079 Primary osteoarthritis, unspecified ankle and foot: Secondary | ICD-10-CM

## 2019-10-27 ENCOUNTER — Telehealth: Payer: Self-pay

## 2019-10-27 NOTE — Telephone Encounter (Signed)
Left message on 2nd follow up call. 

## 2019-10-27 NOTE — Telephone Encounter (Signed)
Left message on follow up call. 

## 2019-11-10 ENCOUNTER — Encounter: Payer: Self-pay | Admitting: Gastroenterology

## 2019-11-15 DIAGNOSIS — H6121 Impacted cerumen, right ear: Secondary | ICD-10-CM | POA: Diagnosis not present

## 2019-11-15 DIAGNOSIS — Z8669 Personal history of other diseases of the nervous system and sense organs: Secondary | ICD-10-CM | POA: Diagnosis not present

## 2019-11-15 DIAGNOSIS — H9191 Unspecified hearing loss, right ear: Secondary | ICD-10-CM | POA: Diagnosis not present

## 2019-12-09 DIAGNOSIS — M170 Bilateral primary osteoarthritis of knee: Secondary | ICD-10-CM | POA: Diagnosis not present

## 2019-12-14 DIAGNOSIS — D1801 Hemangioma of skin and subcutaneous tissue: Secondary | ICD-10-CM | POA: Diagnosis not present

## 2019-12-14 DIAGNOSIS — L814 Other melanin hyperpigmentation: Secondary | ICD-10-CM | POA: Diagnosis not present

## 2019-12-14 DIAGNOSIS — D225 Melanocytic nevi of trunk: Secondary | ICD-10-CM | POA: Diagnosis not present

## 2019-12-14 DIAGNOSIS — L821 Other seborrheic keratosis: Secondary | ICD-10-CM | POA: Diagnosis not present

## 2020-06-29 ENCOUNTER — Other Ambulatory Visit: Payer: Self-pay

## 2020-07-03 ENCOUNTER — Other Ambulatory Visit: Payer: Self-pay

## 2020-07-03 ENCOUNTER — Ambulatory Visit (INDEPENDENT_AMBULATORY_CARE_PROVIDER_SITE_OTHER): Payer: Medicare Other | Admitting: Internal Medicine

## 2020-07-03 ENCOUNTER — Encounter: Payer: Self-pay | Admitting: Internal Medicine

## 2020-07-03 VITALS — BP 134/84 | HR 90 | Ht 65.0 in | Wt 306.2 lb

## 2020-07-03 DIAGNOSIS — E890 Postprocedural hypothyroidism: Secondary | ICD-10-CM

## 2020-07-03 DIAGNOSIS — Z8639 Personal history of other endocrine, nutritional and metabolic disease: Secondary | ICD-10-CM

## 2020-07-03 DIAGNOSIS — H05839 Thyroid orbitopathy, unspecified orbit: Secondary | ICD-10-CM | POA: Insufficient documentation

## 2020-07-03 DIAGNOSIS — E0500 Thyrotoxicosis with diffuse goiter without thyrotoxic crisis or storm: Secondary | ICD-10-CM | POA: Diagnosis not present

## 2020-07-03 MED ORDER — LEVOTHYROXINE SODIUM 200 MCG PO TABS: 200.0000 ug | ORAL_TABLET | Freq: Every day | ORAL | 3 refills | Status: DC

## 2020-07-03 NOTE — Patient Instructions (Addendum)
-   STOP NP thyroid  - Start Levothyroxine 200 mcg daily    - You are on levothyroxine - which is your thyroid hormone supplement. You MUST take this consistently.  You should take this first thing in the morning on an empty stomach with water. You should not take it with other medications. Wait 9min to 1hr prior to eating. If you are taking any vitamins - please take these in the evening.   If you miss a dose, please take your missed dose the following day (double the dose for that day). You should have a pill box for ONLY levothyroxine on your bedside table to help you remember to take your medications.   - Please have your thyroid checked in 6 weeks  - Hold biotin 3 days before any thyroid check

## 2020-07-03 NOTE — Progress Notes (Signed)
Name: Molly Patton  MRN/ DOB: [id number], Jan 21, 1956    Age/ Sex: 65 y.o., female    PCP: Lowella Dandy, NP   Reason for Endocrinology Evaluation: Hypothyroidism      Date of Initial Endocrinology Evaluation: 07/03/2020     HPI: [name] is a 65 y.o. female with a past medical history of Paroxysmal A. Fib, OSA , Hx of Graves' Disease and T2DM.  The patient presented for initial endocrinology clinic visit on 07/03/2020 for consultative assistance with her Hypothyroidism   She has been diagnosed with Grave's disease followed by RAI ablation in 2005. She has been on desiccated thyroid for years.   Her hair has been falling  Has hx of right exophthalmos surgery in 2014 . Has noted periorbital swelling, eyes are burring and itchy, denies tearing, has restsis.  Has chronic heat intolerance  Has loose stools  Has chronic tremors  She is not on Biotin   NP thyroid 60 mg, TWO tablet at night    Mother and daughter with thyroid disease  HISTORY:  Past Medical History:  Past Medical History:  Diagnosis Date  . Anxiety   . Arrhythmia   . Asthma   . Atrial fibrillation ([place])   . Chronic sialoadenitis   . Depression   . Diabetes ([place])   . Elevated cholesterol   . History of colon polyps   . Hypertension   . Obesity   . Sleep apnea    On CPAP Machine   . Thyroid disease    Graves disease s/p Iodine ablation now with hypothyroidism.   Past Surgical History:  Past Surgical History:  Procedure Laterality Date  . CARDIAC ELECTROPHYSIOLOGY MAPPING AND ABLATION    . COLONOSCOPY  09/14/2014   Colonic polyps status post polypectomy. Mild sigmoid diverticulosis  . EXCISION OF BREAST LESION  09/04/2007   Small fibroadenoma w/ coarse clustered microcalcification. Dr Amalia Hailey  . eye decompression    . ROTATOR CUFF REPAIR    . SUBMANDIBULAR GLAND EXCISION Right 10/06/2001   Dr Melina Modena     Social History:  reports that she has quit smoking. She has never used smokeless  tobacco. She reports current alcohol use. She reports that she does not use drugs. Family History: family history includes Colon cancer in her cousin; Colon polyps in her sister; Congestive Heart Failure in her mother; Diabetes in her brother, father, and sister; Irritable bowel syndrome in her sister; Prostate cancer in her father.   HOME MEDICATIONS: Allergies as of 07/03/2020      Reactions   Aleve [naproxen Sodium] Swelling   Naproxen Swelling      Medication List       Accurate as of July 03, 2020  8:42 AM. If you have any questions, ask your nurse or doctor.        STOP taking these medications   thyroid 120 MG tablet Commonly known as: ARMOUR Stopped by: Dorita Sciara, MD     TAKE these medications   apixaban 5 MG Tabs tablet Commonly known as: ELIQUIS Take 5 mg by mouth 2 (two) times daily.   Contour Next Test test strip Generic drug: glucose blood 1 each daily.   diazepam 10 MG tablet Commonly known as: VALIUM Take 10 mg by mouth as needed.   levothyroxine 200 MCG tablet Commonly known as: Synthroid Take 1 tablet (200 mcg total) by mouth daily before breakfast. Started by: Dorita Sciara, MD   lisinopril 40 MG tablet  Commonly known as: ZESTRIL Take 40 mg by mouth daily.   metFORMIN 1000 MG tablet Commonly known as: GLUCOPHAGE Take 500 mg by mouth daily.   mometasone-formoterol 200-5 MCG/ACT Aero Commonly known as: DULERA Inhale 2 puffs into the lungs daily. Every Morning   montelukast 10 MG tablet Commonly known as: SINGULAIR Take 10 mg by mouth daily.   omeprazole 40 MG capsule Commonly known as: PRILOSEC Take 40 mg by mouth daily.   oxyCODONE-acetaminophen 5-325 MG tablet Commonly known as: PERCOCET/ROXICET Take 1 tablet by mouth as needed.   pravastatin 10 MG tablet Commonly known as: PRAVACHOL Take 10 mg by mouth daily.         REVIEW OF SYSTEMS: A comprehensive ROS was conducted with the patient and is negative  except as per HPI     OBJECTIVE:  VS: BP 134/84   Pulse 90   Ht 5\' 5"  (1.651 m)   Wt (!) 306 lb 4 oz (138.9 kg)   SpO2 97%   BMI 50.96 kg/m    Wt Readings from Last 3 Encounters:  07/03/20 (!) 306 lb 4 oz (138.9 kg)  10/25/19 282 lb (127.9 kg)  10/07/19 282 lb 6 oz (128.1 kg)     EXAM: General: EYES:  Pt appears well and is in NAD Examination of the eye shows no exophthalmos nor lid lag but has been noted with chemosis of the left eye   Neck: General: Supple without adenopathy. Thyroid: Thyroid size normal.  No goiter or nodules appreciated.  Lungs: Clear with good BS bilat with no rales, rhonchi, or wheezes  Heart: Auscultation: RRR.  Abdomen: Normoactive bowel sounds, soft, nontender, without masses or organomegaly palpable  Extremities:  BL LE: No pretibial edema normal ROM and strength.  Mental Status: Judgment, insight: Intact Memory: Intact for recent and remote events Mood and affect: No depression, anxiety, or agitation     DATA REVIEWED:   05/19/2020 TSH 1.07 uIU/mL  FT4 0.78 ng/dL  FT3 2.8 pg/ml      ASSESSMENT/PLAN/RECOMMENDATIONS:   PostAblative Hypothyroidism :  - Pt with multiple nonspecific symptoms -  I have advised the patient for my preference to go with Levothyroxine rather then dessicated thyroid, due to more stability with T4 content in levothyroxine and also armour thyroid has non-physiologic levels of T4:T3 of  4:1 (physiologic levels 13:1 to 16:1) - She is in agreement of this     Medications   - STOP NP thyroid  - Start Levothryoxine 200 mcg daily  - She was provided with a lab slip to take to [facility] in 6 weeks   Medications :   2. Graves' Orbitopathy :  - Pt with hx of right eye sx secondary to Graves' disease in 2014. She continues to have dry eyes, burning and itching of mainly the left eye. She [place] interested in [place] and is under the impression that I prescribe this. I explained to the pt the management of Graves'  orbitopathy is beyond the scope of endocrinology but I will refer her to a specialist for this.   F/U in 3 months     Signed electronically by: Mack Guise, MD  [facility]  [facility] [address] [place], [place] [address] Phone: [phone] FAX: [phone]   CC: Lowella Dandy, NP [place] [address] Phone: [phone] Fax: [phone]   Return to Endocrinology clinic as below: No future appointments.

## 2020-08-03 ENCOUNTER — Other Ambulatory Visit: Payer: Self-pay | Admitting: Internal Medicine

## 2020-08-04 LAB — TSH: TSH: 0.019 u[IU]/mL — ABNORMAL LOW (ref 0.450–4.500)

## 2020-08-09 ENCOUNTER — Telehealth: Payer: Self-pay | Admitting: Internal Medicine

## 2020-08-09 NOTE — Telephone Encounter (Signed)
Patient called to request that we fax her lab results from Dr [facility] visit to her referring provider Dr Sarajane Marek.

## 2020-08-09 NOTE — Telephone Encounter (Signed)
Sherlynn Carbon,  Was this included when you did the referral?

## 2020-08-10 ENCOUNTER — Telehealth: Payer: Self-pay | Admitting: Internal Medicine

## 2020-08-10 DIAGNOSIS — E7800 Pure hypercholesterolemia, unspecified: Secondary | ICD-10-CM | POA: Insufficient documentation

## 2020-08-10 DIAGNOSIS — E0500 Thyrotoxicosis with diffuse goiter without thyrotoxic crisis or storm: Secondary | ICD-10-CM | POA: Diagnosis not present

## 2020-08-10 DIAGNOSIS — H05243 Constant exophthalmos, bilateral: Secondary | ICD-10-CM | POA: Diagnosis not present

## 2020-08-10 DIAGNOSIS — E039 Hypothyroidism, unspecified: Secondary | ICD-10-CM | POA: Insufficient documentation

## 2020-08-10 DIAGNOSIS — H04123 Dry eye syndrome of bilateral lacrimal glands: Secondary | ICD-10-CM | POA: Diagnosis not present

## 2020-08-10 DIAGNOSIS — H532 Diplopia: Secondary | ICD-10-CM | POA: Diagnosis not present

## 2020-08-10 DIAGNOSIS — F419 Anxiety disorder, unspecified: Secondary | ICD-10-CM | POA: Insufficient documentation

## 2020-08-10 MED ORDER — LEVOTHYROXINE SODIUM 175 MCG PO TABS
175.0000 ug | ORAL_TABLET | Freq: Every day | ORAL | 4 refills | Status: DC
Start: 2020-08-10 — End: 2020-12-08

## 2020-08-10 NOTE — Telephone Encounter (Signed)
Please let the pt know that the current dose of levothyroxine is too much and we need to reduce the dose based on her recent labs from yesterday.    Stop Levothyroxine 200 Start levothyroxine 175 mcg daily    New prescription sent to [facility]     Thanks   Abby Nena Jordan, MD  [facility]  [facility] [address] [place], [place] [address] Phone: [phone] FAX: [phone]

## 2020-08-10 NOTE — Telephone Encounter (Signed)
Spoken to patient and notified Dr Shamleffer's comments. Verbalized understanding.   

## 2020-08-11 NOTE — Telephone Encounter (Signed)
Noted  

## 2020-08-14 ENCOUNTER — Other Ambulatory Visit: Payer: Self-pay | Admitting: Ophthalmology

## 2020-08-14 DIAGNOSIS — H05243 Constant exophthalmos, bilateral: Secondary | ICD-10-CM

## 2020-08-24 DIAGNOSIS — J208 Acute bronchitis due to other specified organisms: Secondary | ICD-10-CM | POA: Diagnosis not present

## 2020-08-24 DIAGNOSIS — R0781 Pleurodynia: Secondary | ICD-10-CM | POA: Diagnosis not present

## 2020-08-24 DIAGNOSIS — Z6841 Body Mass Index (BMI) 40.0 and over, adult: Secondary | ICD-10-CM | POA: Diagnosis not present

## 2020-08-24 DIAGNOSIS — J9811 Atelectasis: Secondary | ICD-10-CM | POA: Diagnosis not present

## 2020-08-24 DIAGNOSIS — R0602 Shortness of breath: Secondary | ICD-10-CM | POA: Diagnosis not present

## 2020-08-24 DIAGNOSIS — I517 Cardiomegaly: Secondary | ICD-10-CM | POA: Diagnosis not present

## 2020-08-24 DIAGNOSIS — B9689 Other specified bacterial agents as the cause of diseases classified elsewhere: Secondary | ICD-10-CM | POA: Diagnosis not present

## 2020-08-26 ENCOUNTER — Inpatient Hospital Stay: Admission: RE | Admit: 2020-08-26 | Payer: Medicare Other | Source: Ambulatory Visit

## 2020-08-30 DIAGNOSIS — R059 Cough, unspecified: Secondary | ICD-10-CM | POA: Diagnosis not present

## 2020-08-30 DIAGNOSIS — R079 Chest pain, unspecified: Secondary | ICD-10-CM | POA: Diagnosis not present

## 2020-08-30 DIAGNOSIS — R0602 Shortness of breath: Secondary | ICD-10-CM | POA: Diagnosis not present

## 2020-08-30 DIAGNOSIS — I2510 Atherosclerotic heart disease of native coronary artery without angina pectoris: Secondary | ICD-10-CM | POA: Diagnosis not present

## 2020-08-30 DIAGNOSIS — K449 Diaphragmatic hernia without obstruction or gangrene: Secondary | ICD-10-CM | POA: Diagnosis not present

## 2020-08-30 DIAGNOSIS — M546 Pain in thoracic spine: Secondary | ICD-10-CM | POA: Diagnosis not present

## 2020-08-30 DIAGNOSIS — J9811 Atelectasis: Secondary | ICD-10-CM | POA: Diagnosis not present

## 2020-08-31 DIAGNOSIS — R079 Chest pain, unspecified: Secondary | ICD-10-CM | POA: Diagnosis not present

## 2020-09-07 DIAGNOSIS — M546 Pain in thoracic spine: Secondary | ICD-10-CM | POA: Diagnosis not present

## 2020-09-08 DIAGNOSIS — R0600 Dyspnea, unspecified: Secondary | ICD-10-CM | POA: Diagnosis not present

## 2020-09-08 DIAGNOSIS — S22050A Wedge compression fracture of T5-T6 vertebra, initial encounter for closed fracture: Secondary | ICD-10-CM | POA: Diagnosis not present

## 2020-09-08 DIAGNOSIS — E039 Hypothyroidism, unspecified: Secondary | ICD-10-CM | POA: Diagnosis not present

## 2020-09-08 DIAGNOSIS — R609 Edema, unspecified: Secondary | ICD-10-CM | POA: Diagnosis not present

## 2020-09-08 DIAGNOSIS — S81802A Unspecified open wound, left lower leg, initial encounter: Secondary | ICD-10-CM | POA: Diagnosis not present

## 2020-09-11 DIAGNOSIS — Z87891 Personal history of nicotine dependence: Secondary | ICD-10-CM | POA: Diagnosis not present

## 2020-09-11 DIAGNOSIS — Z7984 Long term (current) use of oral hypoglycemic drugs: Secondary | ICD-10-CM | POA: Diagnosis not present

## 2020-09-11 DIAGNOSIS — I10 Essential (primary) hypertension: Secondary | ICD-10-CM | POA: Diagnosis not present

## 2020-09-11 DIAGNOSIS — Z79899 Other long term (current) drug therapy: Secondary | ICD-10-CM | POA: Diagnosis not present

## 2020-09-11 DIAGNOSIS — Z79890 Hormone replacement therapy (postmenopausal): Secondary | ICD-10-CM | POA: Diagnosis not present

## 2020-09-11 DIAGNOSIS — I4891 Unspecified atrial fibrillation: Secondary | ICD-10-CM | POA: Diagnosis not present

## 2020-09-11 DIAGNOSIS — E079 Disorder of thyroid, unspecified: Secondary | ICD-10-CM | POA: Diagnosis not present

## 2020-09-11 DIAGNOSIS — E119 Type 2 diabetes mellitus without complications: Secondary | ICD-10-CM | POA: Diagnosis not present

## 2020-09-11 DIAGNOSIS — R600 Localized edema: Secondary | ICD-10-CM | POA: Diagnosis not present

## 2020-09-11 DIAGNOSIS — Z6841 Body Mass Index (BMI) 40.0 and over, adult: Secondary | ICD-10-CM | POA: Diagnosis not present

## 2020-09-11 DIAGNOSIS — Z7901 Long term (current) use of anticoagulants: Secondary | ICD-10-CM | POA: Diagnosis not present

## 2020-09-13 DIAGNOSIS — I110 Hypertensive heart disease with heart failure: Secondary | ICD-10-CM | POA: Diagnosis not present

## 2020-09-13 DIAGNOSIS — G4700 Insomnia, unspecified: Secondary | ICD-10-CM | POA: Diagnosis not present

## 2020-09-13 DIAGNOSIS — S81802D Unspecified open wound, left lower leg, subsequent encounter: Secondary | ICD-10-CM | POA: Diagnosis not present

## 2020-09-13 DIAGNOSIS — H6690 Otitis media, unspecified, unspecified ear: Secondary | ICD-10-CM | POA: Diagnosis not present

## 2020-09-13 DIAGNOSIS — I480 Paroxysmal atrial fibrillation: Secondary | ICD-10-CM | POA: Diagnosis not present

## 2020-09-13 DIAGNOSIS — I700 Atherosclerosis of aorta: Secondary | ICD-10-CM | POA: Diagnosis not present

## 2020-09-13 DIAGNOSIS — M1990 Unspecified osteoarthritis, unspecified site: Secondary | ICD-10-CM | POA: Diagnosis not present

## 2020-09-13 DIAGNOSIS — F418 Other specified anxiety disorders: Secondary | ICD-10-CM | POA: Diagnosis not present

## 2020-09-13 DIAGNOSIS — E039 Hypothyroidism, unspecified: Secondary | ICD-10-CM | POA: Diagnosis not present

## 2020-09-13 DIAGNOSIS — E0500 Thyrotoxicosis with diffuse goiter without thyrotoxic crisis or storm: Secondary | ICD-10-CM | POA: Diagnosis not present

## 2020-09-13 DIAGNOSIS — I509 Heart failure, unspecified: Secondary | ICD-10-CM | POA: Diagnosis not present

## 2020-09-13 DIAGNOSIS — E1165 Type 2 diabetes mellitus with hyperglycemia: Secondary | ICD-10-CM | POA: Diagnosis not present

## 2020-09-13 DIAGNOSIS — M8008XD Age-related osteoporosis with current pathological fracture, vertebra(e), subsequent encounter for fracture with routine healing: Secondary | ICD-10-CM | POA: Diagnosis not present

## 2020-09-13 DIAGNOSIS — E782 Mixed hyperlipidemia: Secondary | ICD-10-CM | POA: Diagnosis not present

## 2020-09-13 DIAGNOSIS — J4540 Moderate persistent asthma, uncomplicated: Secondary | ICD-10-CM | POA: Diagnosis not present

## 2020-09-13 DIAGNOSIS — M5136 Other intervertebral disc degeneration, lumbar region: Secondary | ICD-10-CM | POA: Diagnosis not present

## 2020-09-15 DIAGNOSIS — E0500 Thyrotoxicosis with diffuse goiter without thyrotoxic crisis or storm: Secondary | ICD-10-CM | POA: Diagnosis not present

## 2020-09-15 DIAGNOSIS — M1990 Unspecified osteoarthritis, unspecified site: Secondary | ICD-10-CM | POA: Diagnosis not present

## 2020-09-15 DIAGNOSIS — E039 Hypothyroidism, unspecified: Secondary | ICD-10-CM | POA: Diagnosis not present

## 2020-09-15 DIAGNOSIS — J4540 Moderate persistent asthma, uncomplicated: Secondary | ICD-10-CM | POA: Diagnosis not present

## 2020-09-15 DIAGNOSIS — G4700 Insomnia, unspecified: Secondary | ICD-10-CM | POA: Diagnosis not present

## 2020-09-15 DIAGNOSIS — M8008XD Age-related osteoporosis with current pathological fracture, vertebra(e), subsequent encounter for fracture with routine healing: Secondary | ICD-10-CM | POA: Diagnosis not present

## 2020-09-15 DIAGNOSIS — H6690 Otitis media, unspecified, unspecified ear: Secondary | ICD-10-CM | POA: Diagnosis not present

## 2020-09-15 DIAGNOSIS — I700 Atherosclerosis of aorta: Secondary | ICD-10-CM | POA: Diagnosis not present

## 2020-09-15 DIAGNOSIS — E782 Mixed hyperlipidemia: Secondary | ICD-10-CM | POA: Diagnosis not present

## 2020-09-15 DIAGNOSIS — I110 Hypertensive heart disease with heart failure: Secondary | ICD-10-CM | POA: Diagnosis not present

## 2020-09-15 DIAGNOSIS — E1165 Type 2 diabetes mellitus with hyperglycemia: Secondary | ICD-10-CM | POA: Diagnosis not present

## 2020-09-15 DIAGNOSIS — F418 Other specified anxiety disorders: Secondary | ICD-10-CM | POA: Diagnosis not present

## 2020-09-15 DIAGNOSIS — S81802D Unspecified open wound, left lower leg, subsequent encounter: Secondary | ICD-10-CM | POA: Diagnosis not present

## 2020-09-15 DIAGNOSIS — M5136 Other intervertebral disc degeneration, lumbar region: Secondary | ICD-10-CM | POA: Diagnosis not present

## 2020-09-15 DIAGNOSIS — I480 Paroxysmal atrial fibrillation: Secondary | ICD-10-CM | POA: Diagnosis not present

## 2020-09-15 DIAGNOSIS — I509 Heart failure, unspecified: Secondary | ICD-10-CM | POA: Diagnosis not present

## 2020-09-18 DIAGNOSIS — S81802D Unspecified open wound, left lower leg, subsequent encounter: Secondary | ICD-10-CM | POA: Diagnosis not present

## 2020-09-18 DIAGNOSIS — M5136 Other intervertebral disc degeneration, lumbar region: Secondary | ICD-10-CM | POA: Diagnosis not present

## 2020-09-18 DIAGNOSIS — E0500 Thyrotoxicosis with diffuse goiter without thyrotoxic crisis or storm: Secondary | ICD-10-CM | POA: Diagnosis not present

## 2020-09-18 DIAGNOSIS — M8008XD Age-related osteoporosis with current pathological fracture, vertebra(e), subsequent encounter for fracture with routine healing: Secondary | ICD-10-CM | POA: Diagnosis not present

## 2020-09-18 DIAGNOSIS — J4540 Moderate persistent asthma, uncomplicated: Secondary | ICD-10-CM | POA: Diagnosis not present

## 2020-09-18 DIAGNOSIS — I480 Paroxysmal atrial fibrillation: Secondary | ICD-10-CM | POA: Diagnosis not present

## 2020-09-18 DIAGNOSIS — G4700 Insomnia, unspecified: Secondary | ICD-10-CM | POA: Diagnosis not present

## 2020-09-18 DIAGNOSIS — I509 Heart failure, unspecified: Secondary | ICD-10-CM | POA: Diagnosis not present

## 2020-09-18 DIAGNOSIS — M1990 Unspecified osteoarthritis, unspecified site: Secondary | ICD-10-CM | POA: Diagnosis not present

## 2020-09-18 DIAGNOSIS — E782 Mixed hyperlipidemia: Secondary | ICD-10-CM | POA: Diagnosis not present

## 2020-09-18 DIAGNOSIS — I110 Hypertensive heart disease with heart failure: Secondary | ICD-10-CM | POA: Diagnosis not present

## 2020-09-18 DIAGNOSIS — I700 Atherosclerosis of aorta: Secondary | ICD-10-CM | POA: Diagnosis not present

## 2020-09-18 DIAGNOSIS — E1165 Type 2 diabetes mellitus with hyperglycemia: Secondary | ICD-10-CM | POA: Diagnosis not present

## 2020-09-18 DIAGNOSIS — E039 Hypothyroidism, unspecified: Secondary | ICD-10-CM | POA: Diagnosis not present

## 2020-09-18 DIAGNOSIS — F418 Other specified anxiety disorders: Secondary | ICD-10-CM | POA: Diagnosis not present

## 2020-09-18 DIAGNOSIS — H6690 Otitis media, unspecified, unspecified ear: Secondary | ICD-10-CM | POA: Diagnosis not present

## 2020-09-20 DIAGNOSIS — E0500 Thyrotoxicosis with diffuse goiter without thyrotoxic crisis or storm: Secondary | ICD-10-CM | POA: Diagnosis not present

## 2020-09-20 DIAGNOSIS — I509 Heart failure, unspecified: Secondary | ICD-10-CM | POA: Diagnosis not present

## 2020-09-20 DIAGNOSIS — G4700 Insomnia, unspecified: Secondary | ICD-10-CM | POA: Diagnosis not present

## 2020-09-20 DIAGNOSIS — M8008XD Age-related osteoporosis with current pathological fracture, vertebra(e), subsequent encounter for fracture with routine healing: Secondary | ICD-10-CM | POA: Diagnosis not present

## 2020-09-20 DIAGNOSIS — E782 Mixed hyperlipidemia: Secondary | ICD-10-CM | POA: Diagnosis not present

## 2020-09-20 DIAGNOSIS — F418 Other specified anxiety disorders: Secondary | ICD-10-CM | POA: Diagnosis not present

## 2020-09-20 DIAGNOSIS — J4540 Moderate persistent asthma, uncomplicated: Secondary | ICD-10-CM | POA: Diagnosis not present

## 2020-09-20 DIAGNOSIS — I700 Atherosclerosis of aorta: Secondary | ICD-10-CM | POA: Diagnosis not present

## 2020-09-20 DIAGNOSIS — E039 Hypothyroidism, unspecified: Secondary | ICD-10-CM | POA: Diagnosis not present

## 2020-09-20 DIAGNOSIS — M5136 Other intervertebral disc degeneration, lumbar region: Secondary | ICD-10-CM | POA: Diagnosis not present

## 2020-09-20 DIAGNOSIS — M1990 Unspecified osteoarthritis, unspecified site: Secondary | ICD-10-CM | POA: Diagnosis not present

## 2020-09-20 DIAGNOSIS — E1165 Type 2 diabetes mellitus with hyperglycemia: Secondary | ICD-10-CM | POA: Diagnosis not present

## 2020-09-20 DIAGNOSIS — I480 Paroxysmal atrial fibrillation: Secondary | ICD-10-CM | POA: Diagnosis not present

## 2020-09-20 DIAGNOSIS — S81802D Unspecified open wound, left lower leg, subsequent encounter: Secondary | ICD-10-CM | POA: Diagnosis not present

## 2020-09-20 DIAGNOSIS — I110 Hypertensive heart disease with heart failure: Secondary | ICD-10-CM | POA: Diagnosis not present

## 2020-09-20 DIAGNOSIS — H6690 Otitis media, unspecified, unspecified ear: Secondary | ICD-10-CM | POA: Diagnosis not present

## 2020-09-22 DIAGNOSIS — M546 Pain in thoracic spine: Secondary | ICD-10-CM | POA: Diagnosis not present

## 2020-09-22 DIAGNOSIS — S22050A Wedge compression fracture of T5-T6 vertebra, initial encounter for closed fracture: Secondary | ICD-10-CM | POA: Diagnosis not present

## 2020-09-25 DIAGNOSIS — I110 Hypertensive heart disease with heart failure: Secondary | ICD-10-CM | POA: Diagnosis not present

## 2020-09-25 DIAGNOSIS — J4540 Moderate persistent asthma, uncomplicated: Secondary | ICD-10-CM | POA: Diagnosis not present

## 2020-09-25 DIAGNOSIS — M8008XD Age-related osteoporosis with current pathological fracture, vertebra(e), subsequent encounter for fracture with routine healing: Secondary | ICD-10-CM | POA: Diagnosis not present

## 2020-09-25 DIAGNOSIS — E1165 Type 2 diabetes mellitus with hyperglycemia: Secondary | ICD-10-CM | POA: Diagnosis not present

## 2020-09-25 DIAGNOSIS — E0500 Thyrotoxicosis with diffuse goiter without thyrotoxic crisis or storm: Secondary | ICD-10-CM | POA: Diagnosis not present

## 2020-09-25 DIAGNOSIS — S81802D Unspecified open wound, left lower leg, subsequent encounter: Secondary | ICD-10-CM | POA: Diagnosis not present

## 2020-09-25 DIAGNOSIS — F418 Other specified anxiety disorders: Secondary | ICD-10-CM | POA: Diagnosis not present

## 2020-09-25 DIAGNOSIS — G4700 Insomnia, unspecified: Secondary | ICD-10-CM | POA: Diagnosis not present

## 2020-09-25 DIAGNOSIS — M1990 Unspecified osteoarthritis, unspecified site: Secondary | ICD-10-CM | POA: Diagnosis not present

## 2020-09-25 DIAGNOSIS — I700 Atherosclerosis of aorta: Secondary | ICD-10-CM | POA: Diagnosis not present

## 2020-09-25 DIAGNOSIS — E039 Hypothyroidism, unspecified: Secondary | ICD-10-CM | POA: Diagnosis not present

## 2020-09-25 DIAGNOSIS — E782 Mixed hyperlipidemia: Secondary | ICD-10-CM | POA: Diagnosis not present

## 2020-09-25 DIAGNOSIS — M5136 Other intervertebral disc degeneration, lumbar region: Secondary | ICD-10-CM | POA: Diagnosis not present

## 2020-09-25 DIAGNOSIS — H6690 Otitis media, unspecified, unspecified ear: Secondary | ICD-10-CM | POA: Diagnosis not present

## 2020-09-25 DIAGNOSIS — I480 Paroxysmal atrial fibrillation: Secondary | ICD-10-CM | POA: Diagnosis not present

## 2020-09-25 DIAGNOSIS — I509 Heart failure, unspecified: Secondary | ICD-10-CM | POA: Diagnosis not present

## 2020-09-28 DIAGNOSIS — F418 Other specified anxiety disorders: Secondary | ICD-10-CM | POA: Diagnosis not present

## 2020-09-28 DIAGNOSIS — S81802D Unspecified open wound, left lower leg, subsequent encounter: Secondary | ICD-10-CM | POA: Diagnosis not present

## 2020-09-28 DIAGNOSIS — I700 Atherosclerosis of aorta: Secondary | ICD-10-CM | POA: Diagnosis not present

## 2020-09-28 DIAGNOSIS — M8008XD Age-related osteoporosis with current pathological fracture, vertebra(e), subsequent encounter for fracture with routine healing: Secondary | ICD-10-CM | POA: Diagnosis not present

## 2020-09-28 DIAGNOSIS — E039 Hypothyroidism, unspecified: Secondary | ICD-10-CM | POA: Diagnosis not present

## 2020-09-28 DIAGNOSIS — E782 Mixed hyperlipidemia: Secondary | ICD-10-CM | POA: Diagnosis not present

## 2020-09-28 DIAGNOSIS — H6690 Otitis media, unspecified, unspecified ear: Secondary | ICD-10-CM | POA: Diagnosis not present

## 2020-09-28 DIAGNOSIS — J4540 Moderate persistent asthma, uncomplicated: Secondary | ICD-10-CM | POA: Diagnosis not present

## 2020-09-28 DIAGNOSIS — I480 Paroxysmal atrial fibrillation: Secondary | ICD-10-CM | POA: Diagnosis not present

## 2020-09-28 DIAGNOSIS — M5136 Other intervertebral disc degeneration, lumbar region: Secondary | ICD-10-CM | POA: Diagnosis not present

## 2020-09-28 DIAGNOSIS — E0500 Thyrotoxicosis with diffuse goiter without thyrotoxic crisis or storm: Secondary | ICD-10-CM | POA: Diagnosis not present

## 2020-09-28 DIAGNOSIS — E1165 Type 2 diabetes mellitus with hyperglycemia: Secondary | ICD-10-CM | POA: Diagnosis not present

## 2020-09-28 DIAGNOSIS — I110 Hypertensive heart disease with heart failure: Secondary | ICD-10-CM | POA: Diagnosis not present

## 2020-09-28 DIAGNOSIS — I509 Heart failure, unspecified: Secondary | ICD-10-CM | POA: Diagnosis not present

## 2020-09-28 DIAGNOSIS — M1990 Unspecified osteoarthritis, unspecified site: Secondary | ICD-10-CM | POA: Diagnosis not present

## 2020-09-28 DIAGNOSIS — G4700 Insomnia, unspecified: Secondary | ICD-10-CM | POA: Diagnosis not present

## 2020-10-02 DIAGNOSIS — J4540 Moderate persistent asthma, uncomplicated: Secondary | ICD-10-CM | POA: Diagnosis not present

## 2020-10-02 DIAGNOSIS — M8008XD Age-related osteoporosis with current pathological fracture, vertebra(e), subsequent encounter for fracture with routine healing: Secondary | ICD-10-CM | POA: Diagnosis not present

## 2020-10-02 DIAGNOSIS — I110 Hypertensive heart disease with heart failure: Secondary | ICD-10-CM | POA: Diagnosis not present

## 2020-10-02 DIAGNOSIS — I480 Paroxysmal atrial fibrillation: Secondary | ICD-10-CM | POA: Diagnosis not present

## 2020-10-02 DIAGNOSIS — D485 Neoplasm of uncertain behavior of skin: Secondary | ICD-10-CM | POA: Diagnosis not present

## 2020-10-02 DIAGNOSIS — H6690 Otitis media, unspecified, unspecified ear: Secondary | ICD-10-CM | POA: Diagnosis not present

## 2020-10-02 DIAGNOSIS — E0500 Thyrotoxicosis with diffuse goiter without thyrotoxic crisis or storm: Secondary | ICD-10-CM | POA: Diagnosis not present

## 2020-10-02 DIAGNOSIS — E1165 Type 2 diabetes mellitus with hyperglycemia: Secondary | ICD-10-CM | POA: Diagnosis not present

## 2020-10-02 DIAGNOSIS — M1990 Unspecified osteoarthritis, unspecified site: Secondary | ICD-10-CM | POA: Diagnosis not present

## 2020-10-02 DIAGNOSIS — S81802D Unspecified open wound, left lower leg, subsequent encounter: Secondary | ICD-10-CM | POA: Diagnosis not present

## 2020-10-02 DIAGNOSIS — D0461 Carcinoma in situ of skin of right upper limb, including shoulder: Secondary | ICD-10-CM | POA: Diagnosis not present

## 2020-10-02 DIAGNOSIS — G4700 Insomnia, unspecified: Secondary | ICD-10-CM | POA: Diagnosis not present

## 2020-10-02 DIAGNOSIS — I509 Heart failure, unspecified: Secondary | ICD-10-CM | POA: Diagnosis not present

## 2020-10-02 DIAGNOSIS — M5136 Other intervertebral disc degeneration, lumbar region: Secondary | ICD-10-CM | POA: Diagnosis not present

## 2020-10-02 DIAGNOSIS — E782 Mixed hyperlipidemia: Secondary | ICD-10-CM | POA: Diagnosis not present

## 2020-10-02 DIAGNOSIS — F418 Other specified anxiety disorders: Secondary | ICD-10-CM | POA: Diagnosis not present

## 2020-10-02 DIAGNOSIS — E039 Hypothyroidism, unspecified: Secondary | ICD-10-CM | POA: Diagnosis not present

## 2020-10-02 DIAGNOSIS — I700 Atherosclerosis of aorta: Secondary | ICD-10-CM | POA: Diagnosis not present

## 2020-10-05 ENCOUNTER — Ambulatory Visit: Payer: Medicare Other | Admitting: Internal Medicine

## 2020-10-05 DIAGNOSIS — E039 Hypothyroidism, unspecified: Secondary | ICD-10-CM | POA: Diagnosis not present

## 2020-10-05 DIAGNOSIS — H6690 Otitis media, unspecified, unspecified ear: Secondary | ICD-10-CM | POA: Diagnosis not present

## 2020-10-05 DIAGNOSIS — F418 Other specified anxiety disorders: Secondary | ICD-10-CM | POA: Diagnosis not present

## 2020-10-05 DIAGNOSIS — M1990 Unspecified osteoarthritis, unspecified site: Secondary | ICD-10-CM | POA: Diagnosis not present

## 2020-10-05 DIAGNOSIS — I700 Atherosclerosis of aorta: Secondary | ICD-10-CM | POA: Diagnosis not present

## 2020-10-05 DIAGNOSIS — M8008XD Age-related osteoporosis with current pathological fracture, vertebra(e), subsequent encounter for fracture with routine healing: Secondary | ICD-10-CM | POA: Diagnosis not present

## 2020-10-05 DIAGNOSIS — I480 Paroxysmal atrial fibrillation: Secondary | ICD-10-CM | POA: Diagnosis not present

## 2020-10-05 DIAGNOSIS — E782 Mixed hyperlipidemia: Secondary | ICD-10-CM | POA: Diagnosis not present

## 2020-10-05 DIAGNOSIS — M5136 Other intervertebral disc degeneration, lumbar region: Secondary | ICD-10-CM | POA: Diagnosis not present

## 2020-10-05 DIAGNOSIS — G4700 Insomnia, unspecified: Secondary | ICD-10-CM | POA: Diagnosis not present

## 2020-10-05 DIAGNOSIS — E0500 Thyrotoxicosis with diffuse goiter without thyrotoxic crisis or storm: Secondary | ICD-10-CM | POA: Diagnosis not present

## 2020-10-05 DIAGNOSIS — J4540 Moderate persistent asthma, uncomplicated: Secondary | ICD-10-CM | POA: Diagnosis not present

## 2020-10-05 DIAGNOSIS — I509 Heart failure, unspecified: Secondary | ICD-10-CM | POA: Diagnosis not present

## 2020-10-05 DIAGNOSIS — S81802D Unspecified open wound, left lower leg, subsequent encounter: Secondary | ICD-10-CM | POA: Diagnosis not present

## 2020-10-05 DIAGNOSIS — E1165 Type 2 diabetes mellitus with hyperglycemia: Secondary | ICD-10-CM | POA: Diagnosis not present

## 2020-10-05 DIAGNOSIS — I110 Hypertensive heart disease with heart failure: Secondary | ICD-10-CM | POA: Diagnosis not present

## 2020-10-10 DIAGNOSIS — I509 Heart failure, unspecified: Secondary | ICD-10-CM | POA: Diagnosis not present

## 2020-10-10 DIAGNOSIS — E1165 Type 2 diabetes mellitus with hyperglycemia: Secondary | ICD-10-CM | POA: Diagnosis not present

## 2020-10-10 DIAGNOSIS — F418 Other specified anxiety disorders: Secondary | ICD-10-CM | POA: Diagnosis not present

## 2020-10-10 DIAGNOSIS — G4700 Insomnia, unspecified: Secondary | ICD-10-CM | POA: Diagnosis not present

## 2020-10-10 DIAGNOSIS — H6690 Otitis media, unspecified, unspecified ear: Secondary | ICD-10-CM | POA: Diagnosis not present

## 2020-10-10 DIAGNOSIS — E0500 Thyrotoxicosis with diffuse goiter without thyrotoxic crisis or storm: Secondary | ICD-10-CM | POA: Diagnosis not present

## 2020-10-10 DIAGNOSIS — M5136 Other intervertebral disc degeneration, lumbar region: Secondary | ICD-10-CM | POA: Diagnosis not present

## 2020-10-10 DIAGNOSIS — M1990 Unspecified osteoarthritis, unspecified site: Secondary | ICD-10-CM | POA: Diagnosis not present

## 2020-10-10 DIAGNOSIS — S81802D Unspecified open wound, left lower leg, subsequent encounter: Secondary | ICD-10-CM | POA: Diagnosis not present

## 2020-10-10 DIAGNOSIS — E782 Mixed hyperlipidemia: Secondary | ICD-10-CM | POA: Diagnosis not present

## 2020-10-10 DIAGNOSIS — E039 Hypothyroidism, unspecified: Secondary | ICD-10-CM | POA: Diagnosis not present

## 2020-10-10 DIAGNOSIS — I110 Hypertensive heart disease with heart failure: Secondary | ICD-10-CM | POA: Diagnosis not present

## 2020-10-10 DIAGNOSIS — I480 Paroxysmal atrial fibrillation: Secondary | ICD-10-CM | POA: Diagnosis not present

## 2020-10-10 DIAGNOSIS — I700 Atherosclerosis of aorta: Secondary | ICD-10-CM | POA: Diagnosis not present

## 2020-10-10 DIAGNOSIS — M8008XD Age-related osteoporosis with current pathological fracture, vertebra(e), subsequent encounter for fracture with routine healing: Secondary | ICD-10-CM | POA: Diagnosis not present

## 2020-10-10 DIAGNOSIS — J4540 Moderate persistent asthma, uncomplicated: Secondary | ICD-10-CM | POA: Diagnosis not present

## 2020-10-12 DIAGNOSIS — I110 Hypertensive heart disease with heart failure: Secondary | ICD-10-CM | POA: Diagnosis not present

## 2020-10-12 DIAGNOSIS — E782 Mixed hyperlipidemia: Secondary | ICD-10-CM | POA: Diagnosis not present

## 2020-10-12 DIAGNOSIS — E0500 Thyrotoxicosis with diffuse goiter without thyrotoxic crisis or storm: Secondary | ICD-10-CM | POA: Diagnosis not present

## 2020-10-12 DIAGNOSIS — I480 Paroxysmal atrial fibrillation: Secondary | ICD-10-CM | POA: Diagnosis not present

## 2020-10-12 DIAGNOSIS — G4700 Insomnia, unspecified: Secondary | ICD-10-CM | POA: Diagnosis not present

## 2020-10-12 DIAGNOSIS — S81802D Unspecified open wound, left lower leg, subsequent encounter: Secondary | ICD-10-CM | POA: Diagnosis not present

## 2020-10-12 DIAGNOSIS — J4540 Moderate persistent asthma, uncomplicated: Secondary | ICD-10-CM | POA: Diagnosis not present

## 2020-10-12 DIAGNOSIS — M5136 Other intervertebral disc degeneration, lumbar region: Secondary | ICD-10-CM | POA: Diagnosis not present

## 2020-10-12 DIAGNOSIS — E1165 Type 2 diabetes mellitus with hyperglycemia: Secondary | ICD-10-CM | POA: Diagnosis not present

## 2020-10-12 DIAGNOSIS — H6690 Otitis media, unspecified, unspecified ear: Secondary | ICD-10-CM | POA: Diagnosis not present

## 2020-10-12 DIAGNOSIS — M8008XD Age-related osteoporosis with current pathological fracture, vertebra(e), subsequent encounter for fracture with routine healing: Secondary | ICD-10-CM | POA: Diagnosis not present

## 2020-10-12 DIAGNOSIS — F418 Other specified anxiety disorders: Secondary | ICD-10-CM | POA: Diagnosis not present

## 2020-10-12 DIAGNOSIS — M1990 Unspecified osteoarthritis, unspecified site: Secondary | ICD-10-CM | POA: Diagnosis not present

## 2020-10-12 DIAGNOSIS — I700 Atherosclerosis of aorta: Secondary | ICD-10-CM | POA: Diagnosis not present

## 2020-10-12 DIAGNOSIS — I509 Heart failure, unspecified: Secondary | ICD-10-CM | POA: Diagnosis not present

## 2020-10-12 DIAGNOSIS — E039 Hypothyroidism, unspecified: Secondary | ICD-10-CM | POA: Diagnosis not present

## 2020-10-13 DIAGNOSIS — E039 Hypothyroidism, unspecified: Secondary | ICD-10-CM | POA: Diagnosis not present

## 2020-10-13 DIAGNOSIS — J4540 Moderate persistent asthma, uncomplicated: Secondary | ICD-10-CM | POA: Diagnosis not present

## 2020-10-13 DIAGNOSIS — F418 Other specified anxiety disorders: Secondary | ICD-10-CM | POA: Diagnosis not present

## 2020-10-13 DIAGNOSIS — M1990 Unspecified osteoarthritis, unspecified site: Secondary | ICD-10-CM | POA: Diagnosis not present

## 2020-10-13 DIAGNOSIS — M8008XD Age-related osteoporosis with current pathological fracture, vertebra(e), subsequent encounter for fracture with routine healing: Secondary | ICD-10-CM | POA: Diagnosis not present

## 2020-10-13 DIAGNOSIS — I700 Atherosclerosis of aorta: Secondary | ICD-10-CM | POA: Diagnosis not present

## 2020-10-13 DIAGNOSIS — I509 Heart failure, unspecified: Secondary | ICD-10-CM | POA: Diagnosis not present

## 2020-10-13 DIAGNOSIS — M5136 Other intervertebral disc degeneration, lumbar region: Secondary | ICD-10-CM | POA: Diagnosis not present

## 2020-10-13 DIAGNOSIS — E782 Mixed hyperlipidemia: Secondary | ICD-10-CM | POA: Diagnosis not present

## 2020-10-13 DIAGNOSIS — S81802D Unspecified open wound, left lower leg, subsequent encounter: Secondary | ICD-10-CM | POA: Diagnosis not present

## 2020-10-13 DIAGNOSIS — I480 Paroxysmal atrial fibrillation: Secondary | ICD-10-CM | POA: Diagnosis not present

## 2020-10-13 DIAGNOSIS — H6690 Otitis media, unspecified, unspecified ear: Secondary | ICD-10-CM | POA: Diagnosis not present

## 2020-10-13 DIAGNOSIS — G4700 Insomnia, unspecified: Secondary | ICD-10-CM | POA: Diagnosis not present

## 2020-10-13 DIAGNOSIS — E1165 Type 2 diabetes mellitus with hyperglycemia: Secondary | ICD-10-CM | POA: Diagnosis not present

## 2020-10-13 DIAGNOSIS — E0500 Thyrotoxicosis with diffuse goiter without thyrotoxic crisis or storm: Secondary | ICD-10-CM | POA: Diagnosis not present

## 2020-10-13 DIAGNOSIS — I110 Hypertensive heart disease with heart failure: Secondary | ICD-10-CM | POA: Diagnosis not present

## 2020-10-16 DIAGNOSIS — G4700 Insomnia, unspecified: Secondary | ICD-10-CM | POA: Diagnosis not present

## 2020-10-16 DIAGNOSIS — I509 Heart failure, unspecified: Secondary | ICD-10-CM | POA: Diagnosis not present

## 2020-10-16 DIAGNOSIS — M1990 Unspecified osteoarthritis, unspecified site: Secondary | ICD-10-CM | POA: Diagnosis not present

## 2020-10-16 DIAGNOSIS — E782 Mixed hyperlipidemia: Secondary | ICD-10-CM | POA: Diagnosis not present

## 2020-10-16 DIAGNOSIS — E1165 Type 2 diabetes mellitus with hyperglycemia: Secondary | ICD-10-CM | POA: Diagnosis not present

## 2020-10-16 DIAGNOSIS — I700 Atherosclerosis of aorta: Secondary | ICD-10-CM | POA: Diagnosis not present

## 2020-10-16 DIAGNOSIS — H6690 Otitis media, unspecified, unspecified ear: Secondary | ICD-10-CM | POA: Diagnosis not present

## 2020-10-16 DIAGNOSIS — F418 Other specified anxiety disorders: Secondary | ICD-10-CM | POA: Diagnosis not present

## 2020-10-16 DIAGNOSIS — J4540 Moderate persistent asthma, uncomplicated: Secondary | ICD-10-CM | POA: Diagnosis not present

## 2020-10-16 DIAGNOSIS — S81802D Unspecified open wound, left lower leg, subsequent encounter: Secondary | ICD-10-CM | POA: Diagnosis not present

## 2020-10-16 DIAGNOSIS — E0500 Thyrotoxicosis with diffuse goiter without thyrotoxic crisis or storm: Secondary | ICD-10-CM | POA: Diagnosis not present

## 2020-10-16 DIAGNOSIS — I110 Hypertensive heart disease with heart failure: Secondary | ICD-10-CM | POA: Diagnosis not present

## 2020-10-16 DIAGNOSIS — E039 Hypothyroidism, unspecified: Secondary | ICD-10-CM | POA: Diagnosis not present

## 2020-10-16 DIAGNOSIS — I480 Paroxysmal atrial fibrillation: Secondary | ICD-10-CM | POA: Diagnosis not present

## 2020-10-16 DIAGNOSIS — M5136 Other intervertebral disc degeneration, lumbar region: Secondary | ICD-10-CM | POA: Diagnosis not present

## 2020-10-16 DIAGNOSIS — M8008XD Age-related osteoporosis with current pathological fracture, vertebra(e), subsequent encounter for fracture with routine healing: Secondary | ICD-10-CM | POA: Diagnosis not present

## 2020-10-21 ENCOUNTER — Ambulatory Visit
Admission: RE | Admit: 2020-10-21 | Discharge: 2020-10-21 | Disposition: A | Discharge status: Home / Self Care | Payer: Medicare Other | Source: Ambulatory Visit | Attending: Ophthalmology | Admitting: Ophthalmology

## 2020-10-21 ENCOUNTER — Other Ambulatory Visit: Payer: Self-pay

## 2020-10-21 DIAGNOSIS — H05243 Constant exophthalmos, bilateral: Secondary | ICD-10-CM

## 2020-10-21 DIAGNOSIS — E0500 Thyrotoxicosis with diffuse goiter without thyrotoxic crisis or storm: Secondary | ICD-10-CM | POA: Diagnosis not present

## 2020-10-21 DIAGNOSIS — M6289 Other specified disorders of muscle: Secondary | ICD-10-CM | POA: Diagnosis not present

## 2020-10-21 DIAGNOSIS — H0520 Unspecified exophthalmos: Secondary | ICD-10-CM | POA: Diagnosis not present

## 2020-10-24 DIAGNOSIS — I480 Paroxysmal atrial fibrillation: Secondary | ICD-10-CM | POA: Diagnosis not present

## 2020-10-24 DIAGNOSIS — F418 Other specified anxiety disorders: Secondary | ICD-10-CM | POA: Diagnosis not present

## 2020-10-24 DIAGNOSIS — I700 Atherosclerosis of aorta: Secondary | ICD-10-CM | POA: Diagnosis not present

## 2020-10-24 DIAGNOSIS — I110 Hypertensive heart disease with heart failure: Secondary | ICD-10-CM | POA: Diagnosis not present

## 2020-10-24 DIAGNOSIS — H6690 Otitis media, unspecified, unspecified ear: Secondary | ICD-10-CM | POA: Diagnosis not present

## 2020-10-24 DIAGNOSIS — E782 Mixed hyperlipidemia: Secondary | ICD-10-CM | POA: Diagnosis not present

## 2020-10-24 DIAGNOSIS — E1165 Type 2 diabetes mellitus with hyperglycemia: Secondary | ICD-10-CM | POA: Diagnosis not present

## 2020-10-24 DIAGNOSIS — J4540 Moderate persistent asthma, uncomplicated: Secondary | ICD-10-CM | POA: Diagnosis not present

## 2020-10-24 DIAGNOSIS — M1990 Unspecified osteoarthritis, unspecified site: Secondary | ICD-10-CM | POA: Diagnosis not present

## 2020-10-24 DIAGNOSIS — E039 Hypothyroidism, unspecified: Secondary | ICD-10-CM | POA: Diagnosis not present

## 2020-10-24 DIAGNOSIS — M5136 Other intervertebral disc degeneration, lumbar region: Secondary | ICD-10-CM | POA: Diagnosis not present

## 2020-10-24 DIAGNOSIS — G4700 Insomnia, unspecified: Secondary | ICD-10-CM | POA: Diagnosis not present

## 2020-10-24 DIAGNOSIS — S81802D Unspecified open wound, left lower leg, subsequent encounter: Secondary | ICD-10-CM | POA: Diagnosis not present

## 2020-10-24 DIAGNOSIS — I509 Heart failure, unspecified: Secondary | ICD-10-CM | POA: Diagnosis not present

## 2020-10-24 DIAGNOSIS — M8008XD Age-related osteoporosis with current pathological fracture, vertebra(e), subsequent encounter for fracture with routine healing: Secondary | ICD-10-CM | POA: Diagnosis not present

## 2020-10-24 DIAGNOSIS — E0500 Thyrotoxicosis with diffuse goiter without thyrotoxic crisis or storm: Secondary | ICD-10-CM | POA: Diagnosis not present

## 2020-10-30 DIAGNOSIS — E039 Hypothyroidism, unspecified: Secondary | ICD-10-CM | POA: Diagnosis not present

## 2020-10-30 DIAGNOSIS — I509 Heart failure, unspecified: Secondary | ICD-10-CM | POA: Diagnosis not present

## 2020-10-30 DIAGNOSIS — M8008XD Age-related osteoporosis with current pathological fracture, vertebra(e), subsequent encounter for fracture with routine healing: Secondary | ICD-10-CM | POA: Diagnosis not present

## 2020-10-30 DIAGNOSIS — J4540 Moderate persistent asthma, uncomplicated: Secondary | ICD-10-CM | POA: Diagnosis not present

## 2020-10-30 DIAGNOSIS — S81802D Unspecified open wound, left lower leg, subsequent encounter: Secondary | ICD-10-CM | POA: Diagnosis not present

## 2020-10-30 DIAGNOSIS — G4700 Insomnia, unspecified: Secondary | ICD-10-CM | POA: Diagnosis not present

## 2020-10-30 DIAGNOSIS — I480 Paroxysmal atrial fibrillation: Secondary | ICD-10-CM | POA: Diagnosis not present

## 2020-10-30 DIAGNOSIS — E1165 Type 2 diabetes mellitus with hyperglycemia: Secondary | ICD-10-CM | POA: Diagnosis not present

## 2020-10-30 DIAGNOSIS — E782 Mixed hyperlipidemia: Secondary | ICD-10-CM | POA: Diagnosis not present

## 2020-10-30 DIAGNOSIS — F418 Other specified anxiety disorders: Secondary | ICD-10-CM | POA: Diagnosis not present

## 2020-10-30 DIAGNOSIS — E0500 Thyrotoxicosis with diffuse goiter without thyrotoxic crisis or storm: Secondary | ICD-10-CM | POA: Diagnosis not present

## 2020-10-30 DIAGNOSIS — I700 Atherosclerosis of aorta: Secondary | ICD-10-CM | POA: Diagnosis not present

## 2020-10-30 DIAGNOSIS — H6690 Otitis media, unspecified, unspecified ear: Secondary | ICD-10-CM | POA: Diagnosis not present

## 2020-10-30 DIAGNOSIS — I110 Hypertensive heart disease with heart failure: Secondary | ICD-10-CM | POA: Diagnosis not present

## 2020-10-30 DIAGNOSIS — M5136 Other intervertebral disc degeneration, lumbar region: Secondary | ICD-10-CM | POA: Diagnosis not present

## 2020-10-30 DIAGNOSIS — M1990 Unspecified osteoarthritis, unspecified site: Secondary | ICD-10-CM | POA: Diagnosis not present

## 2020-10-31 DIAGNOSIS — S81802D Unspecified open wound, left lower leg, subsequent encounter: Secondary | ICD-10-CM | POA: Diagnosis not present

## 2020-10-31 DIAGNOSIS — I480 Paroxysmal atrial fibrillation: Secondary | ICD-10-CM | POA: Diagnosis not present

## 2020-10-31 DIAGNOSIS — M8008XD Age-related osteoporosis with current pathological fracture, vertebra(e), subsequent encounter for fracture with routine healing: Secondary | ICD-10-CM | POA: Diagnosis not present

## 2020-10-31 DIAGNOSIS — E782 Mixed hyperlipidemia: Secondary | ICD-10-CM | POA: Diagnosis not present

## 2020-10-31 DIAGNOSIS — F418 Other specified anxiety disorders: Secondary | ICD-10-CM | POA: Diagnosis not present

## 2020-10-31 DIAGNOSIS — E1165 Type 2 diabetes mellitus with hyperglycemia: Secondary | ICD-10-CM | POA: Diagnosis not present

## 2020-10-31 DIAGNOSIS — I509 Heart failure, unspecified: Secondary | ICD-10-CM | POA: Diagnosis not present

## 2020-10-31 DIAGNOSIS — G4700 Insomnia, unspecified: Secondary | ICD-10-CM | POA: Diagnosis not present

## 2020-10-31 DIAGNOSIS — E039 Hypothyroidism, unspecified: Secondary | ICD-10-CM | POA: Diagnosis not present

## 2020-10-31 DIAGNOSIS — M5136 Other intervertebral disc degeneration, lumbar region: Secondary | ICD-10-CM | POA: Diagnosis not present

## 2020-10-31 DIAGNOSIS — J4540 Moderate persistent asthma, uncomplicated: Secondary | ICD-10-CM | POA: Diagnosis not present

## 2020-10-31 DIAGNOSIS — H6690 Otitis media, unspecified, unspecified ear: Secondary | ICD-10-CM | POA: Diagnosis not present

## 2020-10-31 DIAGNOSIS — M1990 Unspecified osteoarthritis, unspecified site: Secondary | ICD-10-CM | POA: Diagnosis not present

## 2020-10-31 DIAGNOSIS — I110 Hypertensive heart disease with heart failure: Secondary | ICD-10-CM | POA: Diagnosis not present

## 2020-10-31 DIAGNOSIS — I700 Atherosclerosis of aorta: Secondary | ICD-10-CM | POA: Diagnosis not present

## 2020-10-31 DIAGNOSIS — E0500 Thyrotoxicosis with diffuse goiter without thyrotoxic crisis or storm: Secondary | ICD-10-CM | POA: Diagnosis not present

## 2020-11-02 DIAGNOSIS — M8008XD Age-related osteoporosis with current pathological fracture, vertebra(e), subsequent encounter for fracture with routine healing: Secondary | ICD-10-CM | POA: Diagnosis not present

## 2020-11-02 DIAGNOSIS — E039 Hypothyroidism, unspecified: Secondary | ICD-10-CM | POA: Diagnosis not present

## 2020-11-02 DIAGNOSIS — M5136 Other intervertebral disc degeneration, lumbar region: Secondary | ICD-10-CM | POA: Diagnosis not present

## 2020-11-02 DIAGNOSIS — G4700 Insomnia, unspecified: Secondary | ICD-10-CM | POA: Diagnosis not present

## 2020-11-02 DIAGNOSIS — J4540 Moderate persistent asthma, uncomplicated: Secondary | ICD-10-CM | POA: Diagnosis not present

## 2020-11-02 DIAGNOSIS — M1990 Unspecified osteoarthritis, unspecified site: Secondary | ICD-10-CM | POA: Diagnosis not present

## 2020-11-02 DIAGNOSIS — I110 Hypertensive heart disease with heart failure: Secondary | ICD-10-CM | POA: Diagnosis not present

## 2020-11-02 DIAGNOSIS — S81802D Unspecified open wound, left lower leg, subsequent encounter: Secondary | ICD-10-CM | POA: Diagnosis not present

## 2020-11-02 DIAGNOSIS — F418 Other specified anxiety disorders: Secondary | ICD-10-CM | POA: Diagnosis not present

## 2020-11-02 DIAGNOSIS — E1165 Type 2 diabetes mellitus with hyperglycemia: Secondary | ICD-10-CM | POA: Diagnosis not present

## 2020-11-02 DIAGNOSIS — M170 Bilateral primary osteoarthritis of knee: Secondary | ICD-10-CM | POA: Diagnosis not present

## 2020-11-02 DIAGNOSIS — I480 Paroxysmal atrial fibrillation: Secondary | ICD-10-CM | POA: Diagnosis not present

## 2020-11-02 DIAGNOSIS — I509 Heart failure, unspecified: Secondary | ICD-10-CM | POA: Diagnosis not present

## 2020-11-02 DIAGNOSIS — E0500 Thyrotoxicosis with diffuse goiter without thyrotoxic crisis or storm: Secondary | ICD-10-CM | POA: Diagnosis not present

## 2020-11-02 DIAGNOSIS — H6690 Otitis media, unspecified, unspecified ear: Secondary | ICD-10-CM | POA: Diagnosis not present

## 2020-11-02 DIAGNOSIS — I700 Atherosclerosis of aorta: Secondary | ICD-10-CM | POA: Diagnosis not present

## 2020-11-02 DIAGNOSIS — E782 Mixed hyperlipidemia: Secondary | ICD-10-CM | POA: Diagnosis not present

## 2020-11-08 DIAGNOSIS — F418 Other specified anxiety disorders: Secondary | ICD-10-CM | POA: Diagnosis not present

## 2020-11-08 DIAGNOSIS — I110 Hypertensive heart disease with heart failure: Secondary | ICD-10-CM | POA: Diagnosis not present

## 2020-11-08 DIAGNOSIS — I700 Atherosclerosis of aorta: Secondary | ICD-10-CM | POA: Diagnosis not present

## 2020-11-08 DIAGNOSIS — E782 Mixed hyperlipidemia: Secondary | ICD-10-CM | POA: Diagnosis not present

## 2020-11-08 DIAGNOSIS — S81802D Unspecified open wound, left lower leg, subsequent encounter: Secondary | ICD-10-CM | POA: Diagnosis not present

## 2020-11-08 DIAGNOSIS — J4540 Moderate persistent asthma, uncomplicated: Secondary | ICD-10-CM | POA: Diagnosis not present

## 2020-11-08 DIAGNOSIS — E0500 Thyrotoxicosis with diffuse goiter without thyrotoxic crisis or storm: Secondary | ICD-10-CM | POA: Diagnosis not present

## 2020-11-08 DIAGNOSIS — I480 Paroxysmal atrial fibrillation: Secondary | ICD-10-CM | POA: Diagnosis not present

## 2020-11-08 DIAGNOSIS — H6690 Otitis media, unspecified, unspecified ear: Secondary | ICD-10-CM | POA: Diagnosis not present

## 2020-11-08 DIAGNOSIS — M5136 Other intervertebral disc degeneration, lumbar region: Secondary | ICD-10-CM | POA: Diagnosis not present

## 2020-11-08 DIAGNOSIS — E1165 Type 2 diabetes mellitus with hyperglycemia: Secondary | ICD-10-CM | POA: Diagnosis not present

## 2020-11-08 DIAGNOSIS — E039 Hypothyroidism, unspecified: Secondary | ICD-10-CM | POA: Diagnosis not present

## 2020-11-08 DIAGNOSIS — M1990 Unspecified osteoarthritis, unspecified site: Secondary | ICD-10-CM | POA: Diagnosis not present

## 2020-11-08 DIAGNOSIS — M8008XD Age-related osteoporosis with current pathological fracture, vertebra(e), subsequent encounter for fracture with routine healing: Secondary | ICD-10-CM | POA: Diagnosis not present

## 2020-11-08 DIAGNOSIS — G4700 Insomnia, unspecified: Secondary | ICD-10-CM | POA: Diagnosis not present

## 2020-11-08 DIAGNOSIS — I509 Heart failure, unspecified: Secondary | ICD-10-CM | POA: Diagnosis not present

## 2020-11-13 DIAGNOSIS — Z1231 Encounter for screening mammogram for malignant neoplasm of breast: Secondary | ICD-10-CM | POA: Diagnosis not present

## 2020-11-13 DIAGNOSIS — Z6841 Body Mass Index (BMI) 40.0 and over, adult: Secondary | ICD-10-CM | POA: Diagnosis not present

## 2020-11-13 DIAGNOSIS — M810 Age-related osteoporosis without current pathological fracture: Secondary | ICD-10-CM | POA: Diagnosis not present

## 2020-11-13 DIAGNOSIS — M8589 Other specified disorders of bone density and structure, multiple sites: Secondary | ICD-10-CM | POA: Diagnosis not present

## 2020-11-15 DIAGNOSIS — H05243 Constant exophthalmos, bilateral: Secondary | ICD-10-CM | POA: Diagnosis not present

## 2020-11-15 DIAGNOSIS — H04123 Dry eye syndrome of bilateral lacrimal glands: Secondary | ICD-10-CM | POA: Diagnosis not present

## 2020-11-15 DIAGNOSIS — E0500 Thyrotoxicosis with diffuse goiter without thyrotoxic crisis or storm: Secondary | ICD-10-CM | POA: Diagnosis not present

## 2020-11-15 DIAGNOSIS — H532 Diplopia: Secondary | ICD-10-CM | POA: Diagnosis not present

## 2020-12-07 DIAGNOSIS — R0781 Pleurodynia: Secondary | ICD-10-CM | POA: Diagnosis not present

## 2020-12-07 DIAGNOSIS — B9689 Other specified bacterial agents as the cause of diseases classified elsewhere: Secondary | ICD-10-CM | POA: Diagnosis not present

## 2020-12-07 DIAGNOSIS — J208 Acute bronchitis due to other specified organisms: Secondary | ICD-10-CM | POA: Diagnosis not present

## 2020-12-07 DIAGNOSIS — Z6841 Body Mass Index (BMI) 40.0 and over, adult: Secondary | ICD-10-CM | POA: Diagnosis not present

## 2020-12-08 ENCOUNTER — Ambulatory Visit (INDEPENDENT_AMBULATORY_CARE_PROVIDER_SITE_OTHER): Payer: Medicare Other | Admitting: Internal Medicine

## 2020-12-08 ENCOUNTER — Other Ambulatory Visit: Payer: Self-pay

## 2020-12-08 ENCOUNTER — Encounter: Payer: Self-pay | Admitting: Internal Medicine

## 2020-12-08 VITALS — BP 136/74 | HR 100 | Ht 65.0 in | Wt 296.0 lb

## 2020-12-08 DIAGNOSIS — S22050A Wedge compression fracture of T5-T6 vertebra, initial encounter for closed fracture: Secondary | ICD-10-CM | POA: Insufficient documentation

## 2020-12-08 DIAGNOSIS — S22050D Wedge compression fracture of T5-T6 vertebra, subsequent encounter for fracture with routine healing: Secondary | ICD-10-CM

## 2020-12-08 DIAGNOSIS — M8000XD Age-related osteoporosis with current pathological fracture, unspecified site, subsequent encounter for fracture with routine healing: Secondary | ICD-10-CM

## 2020-12-08 DIAGNOSIS — S22050S Wedge compression fracture of T5-T6 vertebra, sequela: Secondary | ICD-10-CM | POA: Diagnosis not present

## 2020-12-08 DIAGNOSIS — E890 Postprocedural hypothyroidism: Secondary | ICD-10-CM

## 2020-12-08 DIAGNOSIS — M8000XA Age-related osteoporosis with current pathological fracture, unspecified site, initial encounter for fracture: Secondary | ICD-10-CM | POA: Insufficient documentation

## 2020-12-08 DIAGNOSIS — M8000XS Age-related osteoporosis with current pathological fracture, unspecified site, sequela: Secondary | ICD-10-CM

## 2020-12-08 LAB — COMPREHENSIVE METABOLIC PANEL
ALT: 18 U/L (ref 0–35)
AST: 15 U/L (ref 0–37)
Albumin: 4.1 g/dL (ref 3.5–5.2)
Alkaline Phosphatase: 55 U/L (ref 39–117)
BUN: 18 mg/dL (ref 6–23)
CO2: 27 mEq/L (ref 19–32)
Calcium: 9.8 mg/dL (ref 8.4–10.5)
Chloride: 104 mEq/L (ref 96–112)
Creatinine, Ser: 0.94 mg/dL (ref 0.40–1.20)
GFR: 63.25 mL/min (ref >60.00)
Glucose, Bld: 127 mg/dL — ABNORMAL HIGH (ref 70–99)
Potassium: 3.9 mEq/L (ref 3.5–5.1)
Sodium: 141 mEq/L (ref 135–145)
Total Bilirubin: 0.4 mg/dL (ref 0.2–1.2)
Total Protein: 7.1 g/dL (ref 6.0–8.3)

## 2020-12-08 LAB — VITAMIN D 25 HYDROXY (VIT D DEFICIENCY, FRACTURES): VITD: 33.5 ng/mL (ref 30.00–100.00)

## 2020-12-08 LAB — MAGNESIUM: Magnesium: 1.9 mg/dL (ref 1.5–2.5)

## 2020-12-08 LAB — TSH: TSH: 0.01 u[IU]/mL — ABNORMAL LOW (ref 0.35–5.50)

## 2020-12-08 MED ORDER — LEVOTHYROXINE SODIUM 150 MCG PO TABS: 150.0000 ug | ORAL_TABLET | Freq: Every day | ORAL | 1 refills | Status: DC

## 2020-12-08 NOTE — Patient Instructions (Addendum)
  If your blood work comes back norma, I would recommend Forteo or Tymlos for the treatment of steoporosis due to compression fracture of Vertebrae     24-Hour Urine Collection  You will be collecting your urine for a 24-hour period of time. Your timer starts with your first urine of the morning (For example - If you first pee at [place], your timer will start at [place]) [place] away your first urine of the morning Collect your urine every time you pee for the next 24 hours STOP your urine collection 24 hours after you started the collection (For example - You would stop at 9AM the day after you started)   PLease have your thyroid checked in 8 weeks

## 2020-12-08 NOTE — Progress Notes (Signed)
Name: Molly Patton  MRN/ DOB: [id number], 07-17-1955    Age/ Sex: 65 y.o., female     PCP: Lowella Dandy, NP   Reason for Endocrinology Evaluation: Postablative hypothyroidism     Initial Endocrinology Clinic Visit: 07/03/2020    PATIENT IDENTIFIER: Molly Patton is a 65 y.o., female with a past medical history of Asthma, A. Fib, HTN, OSA , T2DM and Hx of Graves' disease . She has followed with [facility] since 07/03/2020 for consultative assistance with management of her postablative hypothyroidism.   HISTORICAL SUMMARY:  She has been diagnosed with Grave's disease followed by RAI ablation in 2005. She has been on desiccated thyroid for years.   Has hx of right exophthalmos surgery in 2014    She was c/o hair loss and other non-specific symptoms, we switched hr from NP thyroid to Levothyroxine in 06/2020  Mother and daughter with thyroid disease SUBJECTIVE:    Today (12/08/2020):  Molly Patton is here for postablative hypothyroidism  Presented to [facility] urgent care for LE edema, was started on Lasix 09/2020  She has been noted with weight gain    She has a compression fracture of T5 09/2020 , declined arthoplasty. She has a hx of osteoporosis years ago, was on Fosamax for a couple yrs which was ~ 7 yrs ago. Prolia caused back pain.  Pending DXA and treatment through PCP  No prior history of cancer or radiation exposure  Hair loss has improved   Levothryoxine 175 mcg daily   HISTORY:  Past Medical History:  Past Medical History:  Diagnosis Date   Anxiety    Arrhythmia    Asthma    Atrial fibrillation ([facility])    Chronic sialoadenitis    Depression    Diabetes ([place])    Elevated cholesterol    History of colon polyps    Hypertension    Obesity    Sleep apnea    On CPAP Machine    Thyroid disease    Graves disease s/p Iodine ablation now with hypothyroidism.   Past Surgical History:  Past Surgical History:  Procedure Laterality Date   CARDIAC  ELECTROPHYSIOLOGY MAPPING AND ABLATION     COLONOSCOPY  09/14/2014   Colonic polyps status post polypectomy. Mild sigmoid diverticulosis   EXCISION OF BREAST LESION  09/04/2007   Small fibroadenoma w/ coarse clustered microcalcification. Dr Amalia Hailey   eye decompression     ROTATOR CUFF REPAIR     SUBMANDIBULAR GLAND EXCISION Right 10/06/2001   Dr Melina Modena    Social History:  reports that she has quit smoking. She has never used smokeless tobacco. She reports current alcohol use. She reports that she does not use drugs. Family History:  Family History  Problem Relation Age of Onset   Prostate cancer Father    Diabetes Father    Colon cancer Cousin        1st cousin    Colon polyps Sister    Diabetes Sister    Irritable bowel syndrome Sister    Diabetes Brother    Congestive Heart Failure Mother    Esophageal cancer Neg Hx      HOME MEDICATIONS: Allergies as of 12/08/2020       Reactions   Aleve [naproxen Sodium] Swelling   Naproxen Swelling        Medication List        Accurate as of December 08, 2020  9:12 AM. If you have any questions, ask your nurse or  doctor.          apixaban 5 MG Tabs tablet Commonly known as: ELIQUIS Take 5 mg by mouth 2 (two) times daily.   Contour Next Test test strip Generic drug: glucose blood 1 each daily.   diazepam 10 MG tablet Commonly known as: VALIUM Take 10 mg by mouth as needed.   levothyroxine 175 MCG tablet Commonly known as: SYNTHROID Take 1 tablet (175 mcg total) by mouth daily.   lisinopril 40 MG tablet Commonly known as: ZESTRIL Take 40 mg by mouth daily.   metFORMIN 1000 MG tablet Commonly known as: GLUCOPHAGE Take 500 mg by mouth daily.   mometasone-formoterol 200-5 MCG/ACT Aero Commonly known as: DULERA Inhale 2 puffs into the lungs daily. Every Morning   montelukast 10 MG tablet Commonly known as: SINGULAIR Take 10 mg by mouth daily.   omeprazole 40 MG capsule Commonly known as: PRILOSEC Take 40 mg  by mouth daily.   oxyCODONE-acetaminophen 5-325 MG tablet Commonly known as: PERCOCET/ROXICET Take 1 tablet by mouth as needed.   pravastatin 10 MG tablet Commonly known as: PRAVACHOL Take 10 mg by mouth daily.          OBJECTIVE:   PHYSICAL EXAM: VS: BP 136/74 (BP Location: Right Arm, Patient Position: Sitting, Cuff Size: Large)   Pulse 100   Ht '5\' 5"'$  (1.651 m)   Wt 296 lb (134.3 kg)   SpO2 96%   BMI 49.26 kg/m    EXAM: General: Pt appears well and is in NAD  Neck: General: Supple without adenopathy. Thyroid: Thyroid size normal.  No goiter or nodules appreciated.  Lungs: Clear with good BS bilat with no rales, rhonchi, or wheezes  Heart: Auscultation: RRR.  Abdomen: Normoactive bowel sounds, soft, nontender, without masses or organomegaly palpable  Extremities:  BL LE: No pretibial edema normal ROM and strength.  Skin: Hair: Texture and amount normal with gender appropriate distribution Skin Inspection: No rashes Skin Palpation: Skin temperature, texture, and thickness normal to palpation  Neuro:  DTRs: 2+ and symmetric in UE without delay in relaxation phase  Mental Status: Judgment, insight: Intact Orientation: Oriented to time, place, and person Mood and affect: No depression, anxiety, or agitation     DATA REVIEWED: Results for Molly Patton, Molly Patton (MRN [id number]) as of 12/08/2020 16:29  Ref. Range 12/08/2020 09:27  Sodium Latest Ref Range: 135 - 145 mEq/L 141  Potassium Latest Ref Range: 3.5 - 5.1 mEq/L 3.9  Chloride Latest Ref Range: 96 - 112 mEq/L 104  CO2 Latest Ref Range: 19 - 32 mEq/L 27  Glucose Latest Ref Range: 70 - 99 mg/dL 127 (H)  BUN Latest Ref Range: 6 - 23 mg/dL 18  Creatinine Latest Ref Range: 0.40 - 1.20 mg/dL 0.94  Calcium Latest Ref Range: 8.4 - 10.5 mg/dL 9.8  Magnesium Latest Ref Range: 1.5 - 2.5 mg/dL 1.9  Alkaline Phosphatase Latest Ref Range: 39 - 117 U/L 55  Albumin Latest Ref Range: 3.5 - 5.2 g/dL 4.1  AST Latest Ref Range: 0 - 37  U/L 15  ALT Latest Ref Range: 0 - 35 U/L 18  Total Protein Latest Ref Range: 6.0 - 8.3 g/dL 7.1  Total Bilirubin Latest Ref Range: 0.2 - 1.2 mg/dL 0.4  GFR Latest Ref Range: >60.00 mL/min 63.25  VITD Latest Ref Range: 30.00 - 100.00 ng/mL 33.50  TSH Latest Ref Range: 0.35 - 5.50 uIU/mL 0.01 (L)       09/08/2020 TSH 0.134  Ant-TPO Abs 9 IU/L  TSI <  1.0 TRAB 1.16 IU/L ( 0.00-1.75)   CT orbits 10/21/2020  Bilateral proptosis, with improvement since 2014. See above measurements. Previously noted enlargement of the extraocular muscles in 2014 has improved. Muscles are now somewhat atrophic and fatty replaced with overall decreased size since 2014.     MRI 09/06/2020 @ [facility]  T5 superior endplate compression fracture with 50% vertebral body height     ASSESSMENT / PLAN / RECOMMENDATIONS:   Postablative Hypothyroidism:  -Her TSH continues to be low, we will reduce the dose as below -Patient will have TFTs rechecked in 6-8 weeks at PCPs office, orbital CT scan showed no evidence of active orbitopathy.  Medications : Stop levothyroxine 175 MCG Start levothyroxine 150 MCG     2. Graves' Orbitopathy :   - Pt with hx of right eye sx secondary to Graves' disease in 2014.   - She was [facility] by [facility] ( Dr. Sarajane Marek)   3.  Osteoporosis with T5 compression fracture:  -She has been diagnosed with osteoporosis years ago, she was on Fosamax approximately for 2 years but she stopped it approximately 7 years ago around 2015.  She has tried Prolia but that caused back pain -She has a pending DXA on 01/05/2021 -She is going to have this managed by her PCP, I did briefly discuss my recommendations for Tymlos or Forteo -She has no history of cancer nor radiation exposure -We are going to screen her for secondary causes with 24-hour urine collection for calcium, cortisol as well as PTH and serum calcium  She will have her urine dropped off at [facility]  locally as she lives in [place]  Follow-up in 4 months    Addendum: A voicemail was left with levothyroxine change on 12/08/2020 at 1630 Signed electronically by: Mack Guise, MD  [facility] End[facility]  [facility] [address] [place], [place] [address] Phone: [phone] FAX: [phone]      CC: Lowella Dandy, NP [place] [address] Phone: [phone]  Fax: [phone]   Return to Endocrinology clinic as below: No future appointments.

## 2020-12-11 LAB — PARATHYROID HORMONE, INTACT (NO CA): PTH: 24 pg/mL (ref 16–77)

## 2020-12-13 ENCOUNTER — Encounter: Payer: Self-pay | Admitting: Internal Medicine

## 2020-12-20 DIAGNOSIS — E119 Type 2 diabetes mellitus without complications: Secondary | ICD-10-CM | POA: Diagnosis not present

## 2020-12-28 DIAGNOSIS — I10 Essential (primary) hypertension: Secondary | ICD-10-CM | POA: Diagnosis not present

## 2020-12-28 DIAGNOSIS — G4733 Obstructive sleep apnea (adult) (pediatric): Secondary | ICD-10-CM | POA: Diagnosis not present

## 2020-12-28 DIAGNOSIS — J4520 Mild intermittent asthma, uncomplicated: Secondary | ICD-10-CM | POA: Diagnosis not present

## 2020-12-28 DIAGNOSIS — I480 Paroxysmal atrial fibrillation: Secondary | ICD-10-CM | POA: Diagnosis not present

## 2021-01-05 DIAGNOSIS — M85832 Other specified disorders of bone density and structure, left forearm: Secondary | ICD-10-CM | POA: Diagnosis not present

## 2021-01-05 DIAGNOSIS — M8589 Other specified disorders of bone density and structure, multiple sites: Secondary | ICD-10-CM | POA: Diagnosis not present

## 2021-01-05 DIAGNOSIS — M810 Age-related osteoporosis without current pathological fracture: Secondary | ICD-10-CM | POA: Diagnosis not present

## 2021-01-05 DIAGNOSIS — Z1231 Encounter for screening mammogram for malignant neoplasm of breast: Secondary | ICD-10-CM | POA: Diagnosis not present

## 2021-01-18 DIAGNOSIS — J4540 Moderate persistent asthma, uncomplicated: Secondary | ICD-10-CM | POA: Diagnosis not present

## 2021-01-18 DIAGNOSIS — J208 Acute bronchitis due to other specified organisms: Secondary | ICD-10-CM | POA: Diagnosis not present

## 2021-01-18 DIAGNOSIS — B9689 Other specified bacterial agents as the cause of diseases classified elsewhere: Secondary | ICD-10-CM | POA: Diagnosis not present

## 2021-01-26 ENCOUNTER — Other Ambulatory Visit: Payer: Self-pay

## 2021-01-26 ENCOUNTER — Other Ambulatory Visit: Payer: Self-pay | Admitting: Internal Medicine

## 2021-01-26 ENCOUNTER — Other Ambulatory Visit (INDEPENDENT_AMBULATORY_CARE_PROVIDER_SITE_OTHER): Payer: Medicare Other

## 2021-01-26 DIAGNOSIS — E890 Postprocedural hypothyroidism: Secondary | ICD-10-CM

## 2021-01-26 LAB — TSH: TSH: 0.02 u[IU]/mL — ABNORMAL LOW (ref 0.35–5.50)

## 2021-01-26 LAB — T4, FREE: Free T4: 1.6 ng/dL (ref 0.60–1.60)

## 2021-01-31 LAB — CREATININE, URINE, 24 HOUR
Creatinine, 24H Ur: 856 mg/24 hr (ref 800–1800)
Creatinine, Urine: 71.3 mg/dL

## 2021-01-31 LAB — CALCIUM, URINE, 24 HOUR
Calcium, 24H Urine: 26 mg/24 hr (ref 0–320)
Calcium, Urine: 2.2 mg/dL

## 2021-01-31 LAB — TSH

## 2021-02-01 DIAGNOSIS — M170 Bilateral primary osteoarthritis of knee: Secondary | ICD-10-CM | POA: Diagnosis not present

## 2021-02-02 ENCOUNTER — Telehealth: Payer: Self-pay | Admitting: Internal Medicine

## 2021-02-02 NOTE — Telephone Encounter (Signed)
Discussed lab results and bone density results with the patient on 02/02/2021 at 1620     DEXA 01/05/2021 T score left femoral neck -2.6   Patient is having a follow-up with her PCP in November to discuss treatment for osteoporosis, I have offered the patient treatment but she her PCP is local  I have recommended Forteo   Her TSH remains low  Patient to hold off on levothyroxine every Sunday from now on but continue to take levothyroxine 150 Monday through Saturday  PTH, vitamin D, GFR, calcium, magnesium have all come back normal including 24-hour urine for calcium

## 2021-02-03 LAB — CORTISOL, URINE, FREE
Cortisol (Ur), Free: 11 ug/24 hr (ref 6–42)
Cortisol,F,ug/L,U: 9 ug/L

## 2021-02-05 DIAGNOSIS — R42 Dizziness and giddiness: Secondary | ICD-10-CM | POA: Diagnosis not present

## 2021-02-05 DIAGNOSIS — E669 Obesity, unspecified: Secondary | ICD-10-CM | POA: Diagnosis not present

## 2021-02-05 DIAGNOSIS — E079 Disorder of thyroid, unspecified: Secondary | ICD-10-CM | POA: Diagnosis not present

## 2021-02-05 DIAGNOSIS — H9190 Unspecified hearing loss, unspecified ear: Secondary | ICD-10-CM | POA: Diagnosis not present

## 2021-02-06 DIAGNOSIS — I4891 Unspecified atrial fibrillation: Secondary | ICD-10-CM | POA: Diagnosis not present

## 2021-02-06 DIAGNOSIS — Z7952 Long term (current) use of systemic steroids: Secondary | ICD-10-CM | POA: Diagnosis not present

## 2021-02-06 DIAGNOSIS — M1990 Unspecified osteoarthritis, unspecified site: Secondary | ICD-10-CM | POA: Diagnosis not present

## 2021-02-06 DIAGNOSIS — Z79899 Other long term (current) drug therapy: Secondary | ICD-10-CM | POA: Diagnosis not present

## 2021-02-06 DIAGNOSIS — Z6841 Body Mass Index (BMI) 40.0 and over, adult: Secondary | ICD-10-CM | POA: Diagnosis not present

## 2021-02-06 DIAGNOSIS — E890 Postprocedural hypothyroidism: Secondary | ICD-10-CM | POA: Diagnosis not present

## 2021-02-06 DIAGNOSIS — I10 Essential (primary) hypertension: Secondary | ICD-10-CM | POA: Diagnosis not present

## 2021-02-06 DIAGNOSIS — R531 Weakness: Secondary | ICD-10-CM | POA: Diagnosis not present

## 2021-02-06 DIAGNOSIS — R0602 Shortness of breath: Secondary | ICD-10-CM | POA: Diagnosis not present

## 2021-02-06 DIAGNOSIS — K219 Gastro-esophageal reflux disease without esophagitis: Secondary | ICD-10-CM | POA: Diagnosis not present

## 2021-02-06 DIAGNOSIS — Z7901 Long term (current) use of anticoagulants: Secondary | ICD-10-CM | POA: Diagnosis not present

## 2021-02-06 DIAGNOSIS — I517 Cardiomegaly: Secondary | ICD-10-CM | POA: Diagnosis not present

## 2021-02-06 DIAGNOSIS — J4541 Moderate persistent asthma with (acute) exacerbation: Secondary | ICD-10-CM | POA: Diagnosis not present

## 2021-02-06 DIAGNOSIS — I482 Chronic atrial fibrillation, unspecified: Secondary | ICD-10-CM | POA: Diagnosis not present

## 2021-02-06 DIAGNOSIS — G4733 Obstructive sleep apnea (adult) (pediatric): Secondary | ICD-10-CM | POA: Diagnosis not present

## 2021-02-06 DIAGNOSIS — B974 Respiratory syncytial virus as the cause of diseases classified elsewhere: Secondary | ICD-10-CM | POA: Diagnosis not present

## 2021-02-06 DIAGNOSIS — J45901 Unspecified asthma with (acute) exacerbation: Secondary | ICD-10-CM | POA: Diagnosis not present

## 2021-02-06 DIAGNOSIS — R062 Wheezing: Secondary | ICD-10-CM | POA: Diagnosis not present

## 2021-02-06 DIAGNOSIS — Z20822 Contact with and (suspected) exposure to covid-19: Secondary | ICD-10-CM | POA: Diagnosis not present

## 2021-02-06 DIAGNOSIS — I110 Hypertensive heart disease with heart failure: Secondary | ICD-10-CM | POA: Diagnosis not present

## 2021-02-06 DIAGNOSIS — I5033 Acute on chronic diastolic (congestive) heart failure: Secondary | ICD-10-CM | POA: Diagnosis not present

## 2021-02-07 DIAGNOSIS — I4891 Unspecified atrial fibrillation: Secondary | ICD-10-CM | POA: Diagnosis not present

## 2021-02-15 DIAGNOSIS — J4541 Moderate persistent asthma with (acute) exacerbation: Secondary | ICD-10-CM | POA: Diagnosis not present

## 2021-02-15 DIAGNOSIS — B338 Other specified viral diseases: Secondary | ICD-10-CM | POA: Diagnosis not present

## 2021-02-15 DIAGNOSIS — Z6841 Body Mass Index (BMI) 40.0 and over, adult: Secondary | ICD-10-CM | POA: Diagnosis not present

## 2021-02-15 DIAGNOSIS — E1165 Type 2 diabetes mellitus with hyperglycemia: Secondary | ICD-10-CM | POA: Diagnosis not present

## 2021-02-16 DIAGNOSIS — I517 Cardiomegaly: Secondary | ICD-10-CM | POA: Diagnosis not present

## 2021-02-16 DIAGNOSIS — J4541 Moderate persistent asthma with (acute) exacerbation: Secondary | ICD-10-CM | POA: Diagnosis not present

## 2021-02-16 DIAGNOSIS — R0600 Dyspnea, unspecified: Secondary | ICD-10-CM | POA: Diagnosis not present

## 2021-02-16 DIAGNOSIS — R0602 Shortness of breath: Secondary | ICD-10-CM | POA: Diagnosis not present

## 2021-02-22 DIAGNOSIS — J4540 Moderate persistent asthma, uncomplicated: Secondary | ICD-10-CM | POA: Diagnosis not present

## 2021-02-22 DIAGNOSIS — G4700 Insomnia, unspecified: Secondary | ICD-10-CM | POA: Diagnosis not present

## 2021-02-22 DIAGNOSIS — F411 Generalized anxiety disorder: Secondary | ICD-10-CM | POA: Diagnosis not present

## 2021-02-22 DIAGNOSIS — E1165 Type 2 diabetes mellitus with hyperglycemia: Secondary | ICD-10-CM | POA: Diagnosis not present

## 2021-03-07 DIAGNOSIS — I10 Essential (primary) hypertension: Secondary | ICD-10-CM | POA: Diagnosis not present

## 2021-03-07 DIAGNOSIS — I480 Paroxysmal atrial fibrillation: Secondary | ICD-10-CM | POA: Diagnosis not present

## 2021-03-07 DIAGNOSIS — G4733 Obstructive sleep apnea (adult) (pediatric): Secondary | ICD-10-CM | POA: Diagnosis not present

## 2021-03-14 DIAGNOSIS — I480 Paroxysmal atrial fibrillation: Secondary | ICD-10-CM | POA: Diagnosis not present

## 2021-03-14 DIAGNOSIS — I499 Cardiac arrhythmia, unspecified: Secondary | ICD-10-CM | POA: Diagnosis not present

## 2021-03-17 DIAGNOSIS — G4733 Obstructive sleep apnea (adult) (pediatric): Secondary | ICD-10-CM | POA: Diagnosis not present

## 2021-03-17 DIAGNOSIS — I480 Paroxysmal atrial fibrillation: Secondary | ICD-10-CM | POA: Diagnosis not present

## 2021-03-23 DIAGNOSIS — I480 Paroxysmal atrial fibrillation: Secondary | ICD-10-CM | POA: Diagnosis not present

## 2021-03-23 DIAGNOSIS — G4700 Insomnia, unspecified: Secondary | ICD-10-CM | POA: Diagnosis not present

## 2021-03-23 DIAGNOSIS — R2681 Unsteadiness on feet: Secondary | ICD-10-CM | POA: Diagnosis not present

## 2021-03-23 DIAGNOSIS — J4540 Moderate persistent asthma, uncomplicated: Secondary | ICD-10-CM | POA: Diagnosis not present

## 2021-03-23 DIAGNOSIS — F411 Generalized anxiety disorder: Secondary | ICD-10-CM | POA: Diagnosis not present

## 2021-03-27 DIAGNOSIS — G4734 Idiopathic sleep related nonobstructive alveolar hypoventilation: Secondary | ICD-10-CM | POA: Diagnosis not present

## 2021-03-27 DIAGNOSIS — R0683 Snoring: Secondary | ICD-10-CM | POA: Diagnosis not present

## 2021-04-04 DIAGNOSIS — N6311 Unspecified lump in the right breast, upper outer quadrant: Secondary | ICD-10-CM | POA: Diagnosis not present

## 2021-04-04 DIAGNOSIS — R921 Mammographic calcification found on diagnostic imaging of breast: Secondary | ICD-10-CM | POA: Diagnosis not present

## 2021-04-04 DIAGNOSIS — R928 Other abnormal and inconclusive findings on diagnostic imaging of breast: Secondary | ICD-10-CM | POA: Diagnosis not present

## 2021-04-12 ENCOUNTER — Other Ambulatory Visit: Payer: Self-pay

## 2021-04-12 ENCOUNTER — Ambulatory Visit: Payer: Medicare Other | Admitting: Internal Medicine

## 2021-04-12 ENCOUNTER — Encounter: Payer: Self-pay | Admitting: Internal Medicine

## 2021-04-12 VITALS — BP 130/72 | HR 75 | Ht 65.0 in | Wt 287.0 lb

## 2021-04-12 DIAGNOSIS — E890 Postprocedural hypothyroidism: Secondary | ICD-10-CM | POA: Diagnosis not present

## 2021-04-12 DIAGNOSIS — S22050S Wedge compression fracture of T5-T6 vertebra, sequela: Secondary | ICD-10-CM | POA: Diagnosis not present

## 2021-04-12 DIAGNOSIS — M8000XS Age-related osteoporosis with current pathological fracture, unspecified site, sequela: Secondary | ICD-10-CM | POA: Diagnosis not present

## 2021-04-12 NOTE — Progress Notes (Signed)
Name: Molly Patton  MRN/ DOB: [id number], December 04, 1955    Age/ Sex: 66 y.o., female     PCP: Molly Dandy, NP   Reason for Endocrinology Evaluation: Postablative hypothyroidism     Initial Endocrinology Clinic Visit: 07/03/2020    PATIENT IDENTIFIER: [name] is a 66 y.o., female with a past medical history of Asthma, A. Fib, HTN, OSA , T2DM and Hx of Graves' disease . She has followed with [facility] since 07/03/2020 for consultative assistance with management of her postablative hypothyroidism.      HISTORICAL SUMMARY:  She has been diagnosed with Grave's disease followed by RAI ablation in 2005. She has been on desiccated thyroid for years.   Has hx of right exophthalmos surgery in 2014    She was c/o hair loss and other non-specific symptoms, we switched her from NP thyroid to Levothyroxine in 06/2020  Mother and daughter with thyroid disease    OSTEOPOROSIS HISTORY : She has a hx of osteoporosis years ago, was on Fosamax for a couple yrs which was stopped in 2015.  Prolia caused back pain. She had a compression fracture of T5 09/2020  on MRI   24-hr urinary cortisol was normal at 11 ug 12/2020  No prior history of cancer or radiation exposure  SUBJECTIVE:    Today (04/12/2021):  Molly Patton is here for postablative hypothyroidism  Had A. Fib 02/2021 while hospitalized for RSV  Has an episode of palpitations due to anxiety  Denies palpitations  She denies calcium or vitamin D intake   Levothryoxine 150 mcg 6 days a week   HISTORY:  Past Medical History:  Past Medical History:  Diagnosis Date   Anxiety    Arrhythmia    Asthma    Atrial fibrillation (HCC)    Chronic sialoadenitis    Depression    Diabetes ([place])    Elevated cholesterol    History of colon polyps    Hypertension    Obesity    Sleep apnea    On CPAP Machine    Thyroid disease    Graves disease s/p Iodine ablation now with hypothyroidism.   Past Surgical  History:  Past Surgical History:  Procedure Laterality Date   CARDIAC ELECTROPHYSIOLOGY MAPPING AND ABLATION     COLONOSCOPY  09/14/2014   Colonic polyps status post polypectomy. Mild sigmoid diverticulosis   EXCISION OF BREAST LESION  09/04/2007   Small fibroadenoma w/ coarse clustered microcalcification. Dr Amalia Hailey   eye decompression     ROTATOR CUFF REPAIR     SUBMANDIBULAR GLAND EXCISION Right 10/06/2001   Dr Melina Modena    Social History:  reports that she has quit smoking. She has never used smokeless tobacco. She reports current alcohol use. She reports that she does not use drugs. Family History:  Family History  Problem Relation Age of Onset   Prostate cancer Father    Diabetes Father    Colon cancer Cousin        1st cousin    Colon polyps Sister    Diabetes Sister    Irritable bowel syndrome Sister    Diabetes Brother    Congestive Heart Failure Mother    Esophageal cancer Neg Hx      HOME MEDICATIONS: Allergies as of 04/12/2021       Reactions   Aleve [naproxen Sodium] Swelling   Naproxen Swelling        Medication List        Accurate as  of April 12, 2021  7:17 AM. If you have any questions, ask your nurse or doctor.          apixaban 5 MG Tabs tablet Commonly known as: ELIQUIS Take 5 mg by mouth 2 (two) times daily.   Contour Next Test test strip Generic drug: glucose blood 1 each daily.   diazepam 10 MG tablet Commonly known as: VALIUM Take 10 mg by mouth as needed.   levothyroxine 150 MCG tablet Commonly known as: SYNTHROID Take 1 tablet (150 mcg total) by mouth daily before breakfast.   lisinopril 40 MG tablet Commonly known as: ZESTRIL Take 40 mg by mouth daily.   metFORMIN 1000 MG tablet Commonly known as: GLUCOPHAGE Take 500 mg by mouth daily.   mometasone-formoterol 200-5 MCG/ACT Aero Commonly known as: DULERA Inhale 2 puffs into the lungs daily. Every Morning   montelukast 10 MG tablet Commonly known as: SINGULAIR Take 10 mg  by mouth daily.   omeprazole 40 MG capsule Commonly known as: PRILOSEC Take 40 mg by mouth daily.   oxyCODONE-acetaminophen 5-325 MG tablet Commonly known as: PERCOCET/ROXICET Take 1 tablet by mouth as needed.   pravastatin 10 MG tablet Commonly known as: PRAVACHOL Take 10 mg by mouth daily.          OBJECTIVE:   PHYSICAL EXAM: VS: BP 130/72 (BP Location: Left Arm, Patient Position: Sitting, Cuff Size: Large)    Pulse 75    Ht 5\' 5"  (1.651 m)    Wt 287 lb (130.2 kg)    SpO2 95%    BMI 47.76 kg/m    EXAM: General: Pt appears well and is in NAD  Neck: General: Supple without adenopathy. Thyroid: Thyroid size normal.  No goiter or nodules appreciated.  Lungs: Clear with good BS bilat with no rales, rhonchi, or wheezes  Heart: Auscultation: RRR.  Abdomen: Normoactive bowel sounds, soft, nontender, without masses or organomegaly palpable  Extremities:  BL LE: No pretibial edema normal ROM and strength.  Skin: Hair: Texture and amount normal with gender appropriate distribution Skin Inspection: No rashes Skin Palpation: Skin temperature, texture, and thickness normal to palpation  Neuro:  DTRs: 2+ and symmetric in UE without delay in relaxation phase  Mental Status: Judgment, insight: Intact Orientation: Oriented to time, place, and person Mood and affect: No depression, anxiety, or agitation     DATA REVIEWED:   Latest Reference Range & Units 04/12/21 09:13  TSH 0.450 - 4.500 uIU/mL 0.569  T4,Free(Direct) 0.82 - 1.77 ng/dL 1.41       09/08/2020 TSH 0.134  Ant-TPO Abs 9 IU/L  TSI < 1.0 TRAB 1.16 IU/L ( 0.00-1.75)   CT orbits 10/21/2020  Bilateral proptosis, with improvement since 2014. See above measurements. Previously noted enlargement of the extraocular muscles in 2014 has improved. Muscles are now somewhat atrophic and fatty replaced with overall decreased size since 2014.     MRI 09/06/2020 @ [place]  T5 superior endplate compression  fracture with 50% vertebral body height    Results:  Lumbar spine L1-L4 Femoral neck (FN) 33% distal radius  T-score   - 0.8  LFN: -2.6  -1.3  Change in BMD from previous DXA test (%) n/a n/a n/a   ASSESSMENT / PLAN / RECOMMENDATIONS:   Postablative Hypothyroidism:  -Her TSH have normalized but continues to be at lower end of normal, given hx of A.Fib , I prefer her TSH top be between 1-2 uIU/mL  - Will reduce as below  -Patient will have  TFTs rechecked in 6-8 weeks at PCPs office, orbital CT scan showed no evidence of active orbitopathy.  Medications :  Stop levothyroxine 150 MCG 6x a week  Start Levothyroxine 125 mcg daily      2. Graves' Orbitopathy :   - Pt with hx of right eye sx secondary to Graves' disease in 2014.   - She was seen by [facility] ( Dr. Sarajane Marek)   3.  Osteoporosis with T5 compression fracture:  -She has been diagnosed with osteoporosis years ago, she was on Fosamax approximately for 2 years but she stopped it approximately in 2015 .  She has tried Prolia but that caused back pain - I have again recommended Forteo , we discussed this is a daily injection for at least 2 yrs, she would like to look in to this prior to making a decision  - Workup for secondary causes has been normal      Follow-up in 4 months    [place], MD  [facility]  [address] [place], [place] [address] Phone: [phone] FAX: [phone]      CC: Molly Dandy, NP [place] [address] Phone: [phone]  Fax: [phone]   Return to Endocrinology clinic as below: Future Appointments  Date Time Provider [place]  04/12/2021  8:30 AM Garner Dullea, Melanie Crazier, MD [id number] None

## 2021-04-12 NOTE — Patient Instructions (Addendum)
I would recommend starting Forteo daily injections for osteoporosis   Need Citracal 1200 mg daily

## 2021-04-13 LAB — T4, FREE: Free T4: 1.41 ng/dL (ref 0.82–1.77)

## 2021-04-13 LAB — TSH: TSH: 0.569 u[IU]/mL (ref 0.450–4.500)

## 2021-04-13 MED ORDER — LEVOTHYROXINE SODIUM 125 MCG PO TABS: 125.0000 ug | ORAL_TABLET | Freq: Every day | ORAL | 3 refills | Status: DC

## 2021-04-20 DIAGNOSIS — M1990 Unspecified osteoarthritis, unspecified site: Secondary | ICD-10-CM | POA: Diagnosis not present

## 2021-04-20 DIAGNOSIS — M810 Age-related osteoporosis without current pathological fracture: Secondary | ICD-10-CM | POA: Diagnosis not present

## 2021-04-20 DIAGNOSIS — F411 Generalized anxiety disorder: Secondary | ICD-10-CM | POA: Diagnosis not present

## 2021-04-20 DIAGNOSIS — J4540 Moderate persistent asthma, uncomplicated: Secondary | ICD-10-CM | POA: Diagnosis not present

## 2021-05-03 DIAGNOSIS — M5136 Other intervertebral disc degeneration, lumbar region: Secondary | ICD-10-CM | POA: Diagnosis not present

## 2021-05-03 DIAGNOSIS — M810 Age-related osteoporosis without current pathological fracture: Secondary | ICD-10-CM | POA: Diagnosis not present

## 2021-05-03 DIAGNOSIS — S22050S Wedge compression fracture of T5-T6 vertebra, sequela: Secondary | ICD-10-CM | POA: Diagnosis not present

## 2021-05-10 DIAGNOSIS — M170 Bilateral primary osteoarthritis of knee: Secondary | ICD-10-CM | POA: Diagnosis not present

## 2021-05-15 ENCOUNTER — Encounter: Payer: Self-pay | Admitting: Internal Medicine

## 2021-05-15 DIAGNOSIS — S22050S Wedge compression fracture of T5-T6 vertebra, sequela: Secondary | ICD-10-CM

## 2021-05-16 MED ORDER — FORTEO 600 MCG/2.4ML ~~LOC~~ SOPN: 20.0000 ug | PEN_INJECTOR | Freq: Every day | SUBCUTANEOUS | 3 refills | Status: DC

## 2021-05-17 ENCOUNTER — Telehealth: Payer: Self-pay | Admitting: Pharmacy Technician

## 2021-05-17 ENCOUNTER — Other Ambulatory Visit (HOSPITAL_COMMUNITY): Payer: Self-pay

## 2021-05-17 NOTE — Telephone Encounter (Signed)
Patient Advocate Encounter  Received notification from [employer] [facility]) that prior authorization for FORTEO 600MG  is required.   PA submitted on 2.9.23 Key [id number]  Status is pending   [facility] will continue to follow  Luciano Cutter, CPhT Patient Advocate [facility] Phone: [phone] Fax:  [phone]

## 2021-05-18 ENCOUNTER — Other Ambulatory Visit (HOSPITAL_COMMUNITY): Payer: Self-pay

## 2021-05-18 NOTE — Telephone Encounter (Signed)
Patient Advocate Encounter  Prior Authorization for Forteo 641mcg/2.4ml pen injectors has been approved.    PA# N/A  Effective dates: 05/17/21 through 05/18/23  Refill too soon  Spoke with Pharmacy to Process.  Patient Advocate Fax: [phone]

## 2021-05-23 ENCOUNTER — Ambulatory Visit: Payer: Medicare Other | Admitting: Nutrition

## 2021-05-28 ENCOUNTER — Other Ambulatory Visit: Payer: Self-pay | Admitting: *Deleted

## 2021-05-28 ENCOUNTER — Ambulatory Visit: Payer: Medicare Other | Admitting: Podiatry

## 2021-05-28 ENCOUNTER — Encounter: Payer: Self-pay | Admitting: Podiatry

## 2021-05-28 ENCOUNTER — Ambulatory Visit (INDEPENDENT_AMBULATORY_CARE_PROVIDER_SITE_OTHER): Payer: Medicare Other

## 2021-05-28 DIAGNOSIS — M19079 Primary osteoarthritis, unspecified ankle and foot: Secondary | ICD-10-CM

## 2021-05-28 DIAGNOSIS — M19071 Primary osteoarthritis, right ankle and foot: Secondary | ICD-10-CM | POA: Diagnosis not present

## 2021-05-28 MED ORDER — DEXAMETHASONE SODIUM PHOSPHATE 120 MG/30ML IJ SOLN: 4.0000 mg | Freq: Once | INTRAMUSCULAR | Status: DC

## 2021-05-28 NOTE — Progress Notes (Signed)
°  Subjective:  Patient ID: Molly Patton, female    DOB: Jul 14, 1955,   MRN: [id number]  Chief Complaint  Patient presents with   Foot Pain    The right foot is worse this month and is sore and tender and hurts to put shoes on and if I walk for a couple of hours I have to get off of them    66 y.o. female presents for continued pain in the top of her right foot. Was seen over a year ago with Dr. March Rummage and given a series of injections which did help. Relates the pain has returned in the top of her foot. Has also tried topicals including biofreeze.  Marland Kitchen Denies any other pedal complaints. Denies n/v/f/c.   Past Medical History:  Diagnosis Date   Anxiety    Arrhythmia    Asthma    Atrial fibrillation (HCC)    Chronic sialoadenitis    Depression    Diabetes (HCC)    Elevated cholesterol    History of colon polyps    Hypertension    Obesity    Sleep apnea    On CPAP Machine    Thyroid disease    Graves disease s/p Iodine ablation now with hypothyroidism.    Objective:  Physical Exam: Vascular: DP/PT pulses 2/4 bilateral. CFT <3 seconds. Normal hair growth on digits. No edema.  Skin. No lacerations or abrasions bilateral feet.  Musculoskeletal: MMT 5/5 bilateral lower extremities in DF, PF, Inversion and Eversion. Deceased ROM in DF of ankle joint. Tender over dorsum of foot particularly second and third TMTJ. Palpable spurring noted.  Neurological: Sensation intact to light touch.   Assessment:   1. Arthritis of midfoot      Plan:  Patient was evaluated and treated and all questions answered. Discussed midfoot arthritis with patient and treatment options.  X-rays reviewed. No acute fractures or dislocations. Degenerative changes noted throughout midfoot.  Discussed NSAIDS, topicals, and possible injections.  Injection provided today Discussed use of voltaren gel.  Discussed stiff soled shoes and carbon fiber foot plate.  Discussed if pain does not improve can discuss  surgical options.  Patient to follow-up as needed.    Procedure: Injection Tendon/Ligament Discussed alternatives, risks, complications and verbal consent was obtained.  Location: Right second TMTJ. Skin Prep: Alcohol. Injectate: 1cc 0.5% marcaine plain, 1 cc dexamethasone.  Disposition: Patient tolerated procedure well. Injection site dressed with a band-aid.  Post-injection care was discussed and return precautions discussed.    Lorenda Peck, DPM

## 2021-05-30 ENCOUNTER — Telehealth: Payer: Self-pay

## 2021-05-30 NOTE — Telephone Encounter (Signed)
Patient called to get scheduled for the Forteo injection.

## 2021-06-05 ENCOUNTER — Telehealth: Payer: Self-pay | Admitting: Nutrition

## 2021-06-05 NOTE — Telephone Encounter (Signed)
Patient reports that she has heard back from her insurance that the prior auth. Has been approved for her [place] and is wanting it called in.  I told her that it was sent to [facility] and that she needs to take the letter to the pharmacy and gave her my number to call for training.  Reminded her that this medicine needs refrigerated at all times.  She reported good understanding and said will call me to schedule a visit when she gets this medication

## 2021-06-15 DIAGNOSIS — E785 Hyperlipidemia, unspecified: Secondary | ICD-10-CM | POA: Diagnosis not present

## 2021-06-15 DIAGNOSIS — F411 Generalized anxiety disorder: Secondary | ICD-10-CM | POA: Diagnosis not present

## 2021-06-15 DIAGNOSIS — M810 Age-related osteoporosis without current pathological fracture: Secondary | ICD-10-CM | POA: Diagnosis not present

## 2021-06-15 DIAGNOSIS — E1165 Type 2 diabetes mellitus with hyperglycemia: Secondary | ICD-10-CM | POA: Diagnosis not present

## 2021-06-15 DIAGNOSIS — M1990 Unspecified osteoarthritis, unspecified site: Secondary | ICD-10-CM | POA: Diagnosis not present

## 2021-07-04 DIAGNOSIS — G4733 Obstructive sleep apnea (adult) (pediatric): Secondary | ICD-10-CM | POA: Diagnosis not present

## 2021-07-04 DIAGNOSIS — J4520 Mild intermittent asthma, uncomplicated: Secondary | ICD-10-CM | POA: Diagnosis not present

## 2021-07-04 DIAGNOSIS — I10 Essential (primary) hypertension: Secondary | ICD-10-CM | POA: Diagnosis not present

## 2021-07-04 DIAGNOSIS — I480 Paroxysmal atrial fibrillation: Secondary | ICD-10-CM | POA: Diagnosis not present

## 2021-07-05 DIAGNOSIS — I480 Paroxysmal atrial fibrillation: Secondary | ICD-10-CM | POA: Diagnosis not present

## 2021-07-05 DIAGNOSIS — G4733 Obstructive sleep apnea (adult) (pediatric): Secondary | ICD-10-CM | POA: Diagnosis not present

## 2021-07-05 DIAGNOSIS — I10 Essential (primary) hypertension: Secondary | ICD-10-CM | POA: Diagnosis not present

## 2021-07-09 ENCOUNTER — Ambulatory Visit (INDEPENDENT_AMBULATORY_CARE_PROVIDER_SITE_OTHER): Payer: Self-pay | Admitting: Podiatry

## 2021-07-09 DIAGNOSIS — G4733 Obstructive sleep apnea (adult) (pediatric): Secondary | ICD-10-CM | POA: Diagnosis not present

## 2021-07-09 DIAGNOSIS — Z91199 Patient's noncompliance with other medical treatment and regimen due to unspecified reason: Secondary | ICD-10-CM

## 2021-07-09 DIAGNOSIS — M5136 Other intervertebral disc degeneration, lumbar region: Secondary | ICD-10-CM | POA: Diagnosis not present

## 2021-07-09 NOTE — Progress Notes (Signed)
No show

## 2021-07-31 DIAGNOSIS — M7062 Trochanteric bursitis, left hip: Secondary | ICD-10-CM | POA: Diagnosis not present

## 2021-07-31 DIAGNOSIS — M549 Dorsalgia, unspecified: Secondary | ICD-10-CM | POA: Diagnosis not present

## 2021-07-31 DIAGNOSIS — M47816 Spondylosis without myelopathy or radiculopathy, lumbar region: Secondary | ICD-10-CM | POA: Diagnosis not present

## 2021-08-02 DIAGNOSIS — M25559 Pain in unspecified hip: Secondary | ICD-10-CM | POA: Diagnosis not present

## 2021-08-02 DIAGNOSIS — M7062 Trochanteric bursitis, left hip: Secondary | ICD-10-CM | POA: Diagnosis not present

## 2021-08-03 DIAGNOSIS — M7062 Trochanteric bursitis, left hip: Secondary | ICD-10-CM | POA: Diagnosis not present

## 2021-08-09 DIAGNOSIS — M170 Bilateral primary osteoarthritis of knee: Secondary | ICD-10-CM | POA: Diagnosis not present

## 2021-08-09 NOTE — Progress Notes (Signed)
? ?Name: Molly Patton  ?MRN/ DOB: [id number], 1955-06-10    ?Age/ Sex: 66 y.o., female   ? ? ?PCP: Lowella Dandy, NP   ?Reason for Endocrinology Evaluation: Postablative hypothyroidism  ?   ?Initial Endocrinology Clinic Visit: 07/03/2020  ? ? ?PATIENT IDENTIFIER: [name] is a 66 y.o., female with a past medical history of Asthma, A. Fib, HTN, OSA , T2DM and Hx of Graves' disease . She has followed with [facility] since 07/03/2020 for consultative assistance with management of her postablative hypothyroidism.  ? ? ? ? ?HISTORICAL SUMMARY:  ?She has been diagnosed with Grave's disease followed by RAI ablation in 2005. She has been on desiccated thyroid for years.  ? ?Has hx of right exophthalmos surgery in 2014  ? ? ?She was c/o hair loss and other non-specific symptoms, we switched her from NP thyroid to Levothyroxine in 06/2020 ? ?Mother and daughter with thyroid disease ? ? ? ?OSTEOPOROSIS HISTORY : ?She has a hx of osteoporosis years ago, was on Fosamax for a couple yrs which was stopped in 2015.  Prolia caused back pain. ?She had a compression fracture of T5 09/2020  on MRI  ? ?24-hr urinary cortisol was normal at 11 ug 12/2020 ? ?No prior history of cancer or radiation exposure  ? ? ? ?SUBJECTIVE:  ? ? ?Today (08/10/2021):  Ms. Blumer is here for postablative hypothyroidism and osteoporosis . ? ?Had A. Fib 02/2021 , currently on a cardiac monitor  ?Hair stopped falling ~ 2 weeks ago  ?She forgot to hold the Biotin  ?Denies palpitations  ?Denies loose stools or diarrhea  ?Co-Pay Forteo $700  ?Has constant feet pain , has DJD  ?She received a dexamethasone injection in 05/2021 ? ?Levothryoxine 125 mcg daily  ?Caltrate 600 mg BID  ?Forteo 20 mcg daily - not taking  ? ? ?HISTORY:  ?Past Medical History:  ?Past Medical History:  ?Diagnosis Date  ? Anxiety   ? Arrhythmia   ? Asthma   ? Atrial fibrillation ([place])   ? Chronic sialoadenitis   ? Depression   ? Diabetes ([place])   ? Elevated cholesterol    ? History of colon polyps   ? Hypertension   ? Obesity   ? Sleep apnea   ? On CPAP Machine   ? Thyroid disease   ? Graves disease s/p Iodine ablation now with hypothyroidism.  ? ?Past Surgical History:  ?Past Surgical History:  ?Procedure Laterality Date  ? CARDIAC ELECTROPHYSIOLOGY MAPPING AND ABLATION    ? COLONOSCOPY  09/14/2014  ? Colonic polyps status post polypectomy. Mild sigmoid diverticulosis  ? EXCISION OF BREAST LESION  09/04/2007  ? Small fibroadenoma w/ coarse clustered microcalcification. Dr Amalia Hailey  ? eye decompression    ? ROTATOR CUFF REPAIR    ? SUBMANDIBULAR GLAND EXCISION Right 10/06/2001  ? Dr Melina Modena   ? ?Social History:  reports that she has quit smoking. She has never used smokeless tobacco. She reports current alcohol use. She reports that she does not use drugs. ?Family History:  ?Family History  ?Problem Relation Age of Onset  ? Prostate cancer Father   ? Diabetes Father   ? Colon cancer Cousin   ?     1st cousin   ? Colon polyps Sister   ? Diabetes Sister   ? Irritable bowel syndrome Sister   ? Diabetes Brother   ? Congestive Heart Failure Mother   ? Esophageal cancer Neg Hx   ? ? ? ?  HOME MEDICATIONS: ?Allergies as of 08/10/2021   ? ?   Reactions  ? Aleve [naproxen Sodium] Swelling  ? Naproxen Swelling  ? ?  ? ?  ?Medication List  ?  ? ?  ? Accurate as of Aug 10, 2021  8:08 AM. If you have any questions, ask your nurse or doctor.  ?  ?  ? ?  ? ?apixaban 5 MG Tabs tablet ?Commonly known as: ELIQUIS ?Take 5 mg by mouth 2 (two) times daily. ?  ?bupivacaine (PF) 0.25 % Soln injection ?Commonly known as: MARCAINE ?Inject into the skin. ?  ?busPIRone 10 MG tablet ?Commonly known as: BUSPAR ?Take 1 tablet by mouth 2 (two) times daily. ?What changed: Another medication with the same name was removed. Continue taking this medication, and follow the directions you see here. ?Changed by: Dorita Sciara, MD ?  ?Contour Next Test test strip ?Generic drug: glucose blood ?1 each daily. ?  ?diazepam 10 MG  tablet ?Commonly known as: VALIUM ?Take 10 mg by mouth as needed. ?  ?flecainide 50 MG tablet ?Commonly known as: TAMBOCOR ?Take 50 mg by mouth 2 (two) times daily. ?  ?Forteo 600 MCG/2.4ML Sopn ?Generic drug: Teriparatide (Recombinant) ?Inject 20 mcg into the skin daily in the afternoon. ?  ?levothyroxine 125 MCG tablet ?Commonly known as: SYNTHROID ?Take 1 tablet (125 mcg total) by mouth daily. ?  ?lisinopril 40 MG tablet ?Commonly known as: ZESTRIL ?Take 40 mg by mouth daily. ?What changed: Another medication with the same name was removed. Continue taking this medication, and follow the directions you see here. ?Changed by: Dorita Sciara, MD ?  ?metFORMIN 500 MG tablet ?Commonly known as: GLUCOPHAGE ?Take 1 tablet by mouth daily with breakfast. ?  ?metoprolol tartrate 25 MG tablet ?Commonly known as: LOPRESSOR ?Take 25 mg by mouth 2 (two) times daily. ?  ?mometasone-formoterol 200-5 MCG/ACT Aero ?Commonly known as: DULERA ?Inhale 2 puffs into the lungs daily. Every Morning ?  ?montelukast 10 MG tablet ?Commonly known as: SINGULAIR ?Take 10 mg by mouth daily. ?  ?omeprazole 40 MG capsule ?Commonly known as: PRILOSEC ?Take 40 mg by mouth daily. ?  ?oxyCODONE-acetaminophen 5-325 MG tablet ?Commonly known as: PERCOCET/ROXICET ?Take 1 tablet by mouth as needed. ?  ?Ozempic (0.25 or 0.5 MG/DOSE) 2 MG/3ML Sopn ?Generic drug: Semaglutide(0.25 or 0.'5MG'$ /DOS) ?INJECT 0.25 MG UNDER SKIN WEEKLY ?  ?pravastatin 20 MG tablet ?Commonly known as: PRAVACHOL ?Take 20 mg by mouth daily. ?  ?triamcinolone acetonide 40 MG/ML Susp ?Commonly known as: TRIESENCE ?Inject into the articular space. ?  ? ?  ? ? ? ? ?OBJECTIVE:  ? ?PHYSICAL EXAM: ?VS: BP 128/80 (BP Location: Left Arm, Patient Position: Sitting, Cuff Size: Large)   Pulse 69   Ht '5\' 5"'$  (1.651 m)   Wt 273 lb 3.2 oz (123.9 kg)   SpO2 95%   BMI 45.46 kg/m?  ? ? ?EXAM: ?General: Pt appears well and is in NAD  ?Lungs: Clear with good BS bilat with no rales, rhonchi, or  wheezes  ?Heart: Auscultation: RRR.  ?Abdomen: Normoactive bowel sounds, soft, nontender, without masses or organomegaly palpable  ?Extremities:  ?BL LE: No pretibial edema normal ROM and strength.  ?Mental Status: Judgment, insight: Intact ?Orientation: Oriented to time, place, and person ?Mood and affect: No depression, anxiety, or agitation  ? ? ? ?DATA REVIEWED: ? ? Latest Reference Range & Units 04/12/21 09:13  ?TSH 0.450 - 4.500 uIU/mL 0.569  ?T4,Free(Direct) 0.82 - 1.77 ng/dL 1.41  ? ? ? ? ? ?  09/08/2020 ?TSH 0.134  ?Ant-TPO Abs 9 IU/L  ?TSI < 1.0 ?TRAB 1.16 IU/L ( 0.00-1.75) ? ? ?CT orbits 10/21/2020 ? ?Bilateral proptosis, with improvement since 2014. See above ?measurements. Previously noted enlargement of the extraocular ?muscles in 2014 has improved. Muscles are now somewhat atrophic and ?fatty replaced with overall decreased size since 2014. ? ? ? ? ?MRI 09/06/2020 @ [facility]  ?T5 superior endplate compression fracture with 50% vertebral body height  ? ? ?Results: ? Lumbar spine L1-L4 Femoral neck (FN) 33% distal radius  ?T-score  ? - 0.8  ?LFN: -2.6  ?-1.3  ?Change in BMD from previous DXA test (%) n/a n/a n/a  ? ?ASSESSMENT / PLAN / RECOMMENDATIONS:  ? ?Postablative Hypothyroidism: ? ?-Her TSH was normal in January 2023, she is clinically euthyroid ?-She is unable to do her TFTs today because she did not hold the biotin ?-She will return next week ? ?Medications : ? ?Continue levothyroxine 125 mcg daily  ? ? ? ? ?2. Graves' Orbitopathy : ?  ?- Pt with hx of right eye sx secondary to Graves' disease in 2014.   ?- She was seen by [facility] ( Dr. Sarajane Marek) ?  ?3.  Osteoporosis with T5 compression fracture: ? ?-She has been diagnosed with osteoporosis years ago, she was on Fosamax approximately for 2 years but she stopped it  in 2015 .  She has tried Prolia but that caused back pain ?-I have prescribed Forteo but her co-pay $700, patient was provided with [employer] patient  assistance ? ?Medication ?Caltrate 600 mg twice daily ? ? ? ?Follow-up in 4 months ? ? ? ?Mack Guise, MD ? ?[facility]  ?[facility] ?[address] ?[place], [place] [address]

## 2021-08-10 ENCOUNTER — Encounter: Payer: Self-pay | Admitting: Internal Medicine

## 2021-08-10 ENCOUNTER — Ambulatory Visit: Payer: Medicare Other | Admitting: Internal Medicine

## 2021-08-10 VITALS — BP 128/80 | HR 69 | Ht 65.0 in | Wt 273.2 lb

## 2021-08-10 DIAGNOSIS — M8000XS Age-related osteoporosis with current pathological fracture, unspecified site, sequela: Secondary | ICD-10-CM

## 2021-08-10 DIAGNOSIS — E890 Postprocedural hypothyroidism: Secondary | ICD-10-CM | POA: Diagnosis not present

## 2021-08-10 DIAGNOSIS — S22050S Wedge compression fracture of T5-T6 vertebra, sequela: Secondary | ICD-10-CM

## 2021-08-10 NOTE — Patient Instructions (Signed)

## 2021-08-17 ENCOUNTER — Other Ambulatory Visit: Payer: Medicare Other

## 2021-08-17 ENCOUNTER — Other Ambulatory Visit (INDEPENDENT_AMBULATORY_CARE_PROVIDER_SITE_OTHER): Payer: Medicare Other

## 2021-08-17 DIAGNOSIS — S22050S Wedge compression fracture of T5-T6 vertebra, sequela: Secondary | ICD-10-CM | POA: Diagnosis not present

## 2021-08-17 DIAGNOSIS — E890 Postprocedural hypothyroidism: Secondary | ICD-10-CM

## 2021-08-17 LAB — BASIC METABOLIC PANEL
BUN: 28 mg/dL — ABNORMAL HIGH (ref 6–23)
CO2: 25 mEq/L (ref 19–32)
Calcium: 9.4 mg/dL (ref 8.4–10.5)
Chloride: 104 mEq/L (ref 96–112)
Creatinine, Ser: 0.97 mg/dL (ref 0.40–1.20)
GFR: 60.62 mL/min (ref >60.00)
Glucose, Bld: 106 mg/dL — ABNORMAL HIGH (ref 70–99)
Potassium: 4.5 mEq/L (ref 3.5–5.1)
Sodium: 140 mEq/L (ref 135–145)

## 2021-08-17 LAB — TSH: TSH: 0.4 u[IU]/mL (ref 0.35–5.50)

## 2021-08-17 LAB — VITAMIN D 25 HYDROXY (VIT D DEFICIENCY, FRACTURES): VITD: 27.86 ng/mL — ABNORMAL LOW (ref 30.00–100.00)

## 2021-08-17 LAB — T4, FREE: Free T4: 0.87 ng/dL (ref 0.60–1.60)

## 2021-08-20 ENCOUNTER — Ambulatory Visit: Payer: Medicare Other | Admitting: Podiatry

## 2021-08-20 ENCOUNTER — Encounter: Payer: Self-pay | Admitting: Podiatry

## 2021-08-20 DIAGNOSIS — M19071 Primary osteoarthritis, right ankle and foot: Secondary | ICD-10-CM | POA: Diagnosis not present

## 2021-08-20 DIAGNOSIS — M19079 Primary osteoarthritis, unspecified ankle and foot: Secondary | ICD-10-CM

## 2021-08-20 MED ORDER — DEXAMETHASONE SODIUM PHOSPHATE 120 MG/30ML IJ SOLN
4.0000 mg | Freq: Once | INTRAMUSCULAR | Status: AC
  Administered 2021-08-20: 4 mg via INTRA_ARTICULAR

## 2021-08-20 NOTE — Progress Notes (Signed)
?  Subjective:  ?Patient ID: Molly Patton, female    DOB: 04-Sep-1955,   MRN: [id number] ? ?Chief Complaint  ?Patient presents with  ? Foot Pain  ?  The right foot is killing me and the shot did not help and the left foot not as much but I do bruise easy and my doctor stated that I can not have surgery due to the bones are brittle and trying to get me on some medicine  ? ? ?66 y.o. female presents for continued pain in the top of her right foot. Relates right foot injection did not help. Hoping to try another to see if we can get a better spot.  Has also tried topicals including biofreeze and salon pas and has been using voltaren and tried different shoes. Relates her endocrinologist suggested waiting on surgery until she can start forteo  . Denies any other pedal complaints. Denies n/v/f/c.  ? ?Past Medical History:  ?Diagnosis Date  ? Anxiety   ? Arrhythmia   ? Asthma   ? Atrial fibrillation ([place])   ? Chronic sialoadenitis   ? Depression   ? Diabetes ([place])   ? Elevated cholesterol   ? History of colon polyps   ? Hypertension   ? Obesity   ? Sleep apnea   ? On CPAP Machine   ? Thyroid disease   ? Graves disease s/p Iodine ablation now with hypothyroidism.  ? ? ?Objective:  ?Physical Exam: ?Vascular: DP/PT pulses 2/4 bilateral. CFT <3 seconds. Normal hair growth on digits. No edema.  ?Skin. No lacerations or abrasions bilateral feet.  ?Musculoskeletal: MMT 5/5 bilateral lower extremities in DF, PF, Inversion and Eversion. Deceased ROM in DF of ankle joint. Tender over dorsum of foot particularly second and third TMTJ and navicular cuneiform joint. Palpable spurring noted.  ?Neurological: Sensation intact to light touch.  ? ?Assessment:  ? ?1. Arthritis of midfoot   ? ? ? ?Plan:  ?Patient was evaluated and treated and all questions answered. ?Discussed midfoot arthritis with patient and treatment options.  ?X-rays reviewed. No acute fractures or dislocations. Degenerative changes noted throughout midfoot particularly  naviculocuneiform joint.  ?Discussed NSAIDS, topicals, and possible injections.  ?Injection provided today ?Discussed use of voltaren gel and other topicals.  ?Discussed stiff soled shoes and carbon fiber foot plate.  ?Discussed if pain does not improve can discuss surgical options. Will await for her to get on osteoporosis meds to optimize bones however.  ?Patient to follow-up as needed.  ? ? ?Procedure: Injection Tendon/Ligament ?Discussed alternatives, risks, complications and verbal consent was obtained.  ?Location: Right navicular cuneiform joint.  ?Skin Prep: Alcohol. ?Injectate: 1cc 0.5% marcaine plain, 1 cc dexamethasone.  ?Disposition: Patient tolerated procedure well. Injection site dressed with a band-aid.  ?Post-injection care was discussed and return precautions discussed.  ? ? ?Lorenda Peck, DPM  ? ? ? ?

## 2021-08-31 DIAGNOSIS — I480 Paroxysmal atrial fibrillation: Secondary | ICD-10-CM | POA: Diagnosis not present

## 2021-09-14 DIAGNOSIS — Z9181 History of falling: Secondary | ICD-10-CM | POA: Diagnosis not present

## 2021-09-14 DIAGNOSIS — E785 Hyperlipidemia, unspecified: Secondary | ICD-10-CM | POA: Diagnosis not present

## 2021-09-14 DIAGNOSIS — Z1331 Encounter for screening for depression: Secondary | ICD-10-CM | POA: Diagnosis not present

## 2021-09-14 DIAGNOSIS — Z0000 Encounter for general adult medical examination without abnormal findings: Secondary | ICD-10-CM | POA: Diagnosis not present

## 2021-10-11 DIAGNOSIS — R928 Other abnormal and inconclusive findings on diagnostic imaging of breast: Secondary | ICD-10-CM | POA: Diagnosis not present

## 2021-10-11 DIAGNOSIS — R921 Mammographic calcification found on diagnostic imaging of breast: Secondary | ICD-10-CM | POA: Diagnosis not present

## 2021-10-12 ENCOUNTER — Other Ambulatory Visit: Payer: Self-pay | Admitting: Internal Medicine

## 2021-10-12 DIAGNOSIS — R921 Mammographic calcification found on diagnostic imaging of breast: Secondary | ICD-10-CM

## 2021-10-17 ENCOUNTER — Other Ambulatory Visit: Payer: Self-pay | Admitting: Internal Medicine

## 2021-10-17 DIAGNOSIS — R921 Mammographic calcification found on diagnostic imaging of breast: Secondary | ICD-10-CM

## 2021-10-24 ENCOUNTER — Other Ambulatory Visit (HOSPITAL_COMMUNITY): Payer: Self-pay | Admitting: Diagnostic Radiology

## 2021-10-24 ENCOUNTER — Ambulatory Visit
Admission: RE | Admit: 2021-10-24 | Discharge: 2021-10-24 | Disposition: A | Discharge status: Home / Self Care | Payer: Medicare Other | Source: Ambulatory Visit | Attending: Internal Medicine | Admitting: Internal Medicine

## 2021-10-24 DIAGNOSIS — R921 Mammographic calcification found on diagnostic imaging of breast: Secondary | ICD-10-CM

## 2021-10-24 DIAGNOSIS — N6031 Fibrosclerosis of right breast: Secondary | ICD-10-CM | POA: Diagnosis not present

## 2021-10-24 NOTE — Telephone Encounter (Signed)
Opened in error

## 2021-10-25 DIAGNOSIS — Z6841 Body Mass Index (BMI) 40.0 and over, adult: Secondary | ICD-10-CM | POA: Diagnosis not present

## 2021-10-25 DIAGNOSIS — R0683 Snoring: Secondary | ICD-10-CM | POA: Diagnosis not present

## 2021-10-25 DIAGNOSIS — E662 Morbid (severe) obesity with alveolar hypoventilation: Secondary | ICD-10-CM | POA: Diagnosis not present

## 2021-12-05 DIAGNOSIS — L821 Other seborrheic keratosis: Secondary | ICD-10-CM | POA: Diagnosis not present

## 2021-12-05 DIAGNOSIS — I480 Paroxysmal atrial fibrillation: Secondary | ICD-10-CM | POA: Diagnosis not present

## 2021-12-05 DIAGNOSIS — I10 Essential (primary) hypertension: Secondary | ICD-10-CM | POA: Diagnosis not present

## 2021-12-05 DIAGNOSIS — D2239 Melanocytic nevi of other parts of face: Secondary | ICD-10-CM | POA: Diagnosis not present

## 2021-12-05 DIAGNOSIS — D225 Melanocytic nevi of trunk: Secondary | ICD-10-CM | POA: Diagnosis not present

## 2021-12-05 DIAGNOSIS — G4733 Obstructive sleep apnea (adult) (pediatric): Secondary | ICD-10-CM | POA: Diagnosis not present

## 2021-12-05 DIAGNOSIS — L814 Other melanin hyperpigmentation: Secondary | ICD-10-CM | POA: Diagnosis not present

## 2021-12-05 DIAGNOSIS — J4520 Mild intermittent asthma, uncomplicated: Secondary | ICD-10-CM | POA: Diagnosis not present

## 2021-12-06 DIAGNOSIS — M810 Age-related osteoporosis without current pathological fracture: Secondary | ICD-10-CM | POA: Diagnosis not present

## 2021-12-06 DIAGNOSIS — M1990 Unspecified osteoarthritis, unspecified site: Secondary | ICD-10-CM | POA: Diagnosis not present

## 2021-12-06 DIAGNOSIS — F411 Generalized anxiety disorder: Secondary | ICD-10-CM | POA: Diagnosis not present

## 2021-12-06 DIAGNOSIS — E1165 Type 2 diabetes mellitus with hyperglycemia: Secondary | ICD-10-CM | POA: Diagnosis not present

## 2021-12-06 DIAGNOSIS — E785 Hyperlipidemia, unspecified: Secondary | ICD-10-CM | POA: Diagnosis not present

## 2021-12-20 DIAGNOSIS — E119 Type 2 diabetes mellitus without complications: Secondary | ICD-10-CM | POA: Diagnosis not present

## 2021-12-21 DIAGNOSIS — M546 Pain in thoracic spine: Secondary | ICD-10-CM | POA: Diagnosis not present

## 2021-12-21 DIAGNOSIS — M549 Dorsalgia, unspecified: Secondary | ICD-10-CM | POA: Diagnosis not present

## 2022-01-24 DIAGNOSIS — I10 Essential (primary) hypertension: Secondary | ICD-10-CM | POA: Diagnosis not present

## 2022-01-24 DIAGNOSIS — I480 Paroxysmal atrial fibrillation: Secondary | ICD-10-CM | POA: Diagnosis not present

## 2022-01-24 DIAGNOSIS — G4733 Obstructive sleep apnea (adult) (pediatric): Secondary | ICD-10-CM | POA: Diagnosis not present

## 2022-02-14 DIAGNOSIS — M7918 Myalgia, other site: Secondary | ICD-10-CM | POA: Diagnosis not present

## 2022-02-14 DIAGNOSIS — M47816 Spondylosis without myelopathy or radiculopathy, lumbar region: Secondary | ICD-10-CM | POA: Diagnosis not present

## 2022-02-15 ENCOUNTER — Encounter: Payer: Self-pay | Admitting: Internal Medicine

## 2022-02-15 ENCOUNTER — Ambulatory Visit: Payer: Medicare Other | Admitting: Internal Medicine

## 2022-02-15 VITALS — BP 122/80 | HR 61 | Ht 65.0 in | Wt 278.0 lb

## 2022-02-15 DIAGNOSIS — E890 Postprocedural hypothyroidism: Secondary | ICD-10-CM | POA: Diagnosis not present

## 2022-02-15 DIAGNOSIS — Z8781 Personal history of (healed) traumatic fracture: Secondary | ICD-10-CM | POA: Diagnosis not present

## 2022-02-15 DIAGNOSIS — Z8639 Personal history of other endocrine, nutritional and metabolic disease: Secondary | ICD-10-CM

## 2022-02-15 DIAGNOSIS — M810 Age-related osteoporosis without current pathological fracture: Secondary | ICD-10-CM | POA: Diagnosis not present

## 2022-02-15 DIAGNOSIS — M8000XS Age-related osteoporosis with current pathological fracture, unspecified site, sequela: Secondary | ICD-10-CM

## 2022-02-15 DIAGNOSIS — S22050S Wedge compression fracture of T5-T6 vertebra, sequela: Secondary | ICD-10-CM

## 2022-02-15 LAB — BASIC METABOLIC PANEL
BUN: 20 mg/dL (ref 6–23)
CO2: 29 mEq/L (ref 19–32)
Calcium: 10.2 mg/dL (ref 8.4–10.5)
Chloride: 104 mEq/L (ref 96–112)
Creatinine, Ser: 0.72 mg/dL (ref 0.40–1.20)
GFR: 86.38 mL/min (ref >60.00)
Glucose, Bld: 129 mg/dL — ABNORMAL HIGH (ref 70–99)
Potassium: 4.7 mEq/L (ref 3.5–5.1)
Sodium: 139 mEq/L (ref 135–145)

## 2022-02-15 LAB — VITAMIN D 25 HYDROXY (VIT D DEFICIENCY, FRACTURES): VITD: 32.87 ng/mL (ref 30.00–100.00)

## 2022-02-15 LAB — TSH: TSH: 0.5 u[IU]/mL (ref 0.35–5.50)

## 2022-02-15 MED ORDER — LEVOTHYROXINE SODIUM 125 MCG PO TABS: 125.0000 ug | ORAL_TABLET | Freq: Every day | ORAL | 3 refills | Status: DC

## 2022-02-15 NOTE — Progress Notes (Signed)
Name: Molly Patton  MRN/ DOB: [id number], 21-Sep-1955    Age/ Sex: 66 y.o., female     PCP: Lowella Dandy, NP   Reason for Endocrinology Evaluation: Postablative hypothyroidism     Initial Endocrinology Clinic Visit: 07/03/2020    PATIENT IDENTIFIER: [name] is a 66 y.o., female with a past medical history of Asthma, A. Fib, HTN, OSA , T2DM and Hx of Graves' disease . [facility] since 07/03/2020 for consultative assistance with management of her postablative hypothyroidism.      HISTORICAL SUMMARY:  She has been diagnosed with Grave's disease followed by RAI ablation in 2005. She has been on desiccated thyroid for years.   Has hx of right exophthalmos surgery in 2014    She was c/o hair loss and other non-specific symptoms, we switched her from NP thyroid to Levothyroxine in 06/2020  Mother and daughter with thyroid disease    OSTEOPOROSIS HISTORY : She has a hx of osteoporosis years ago, was on Fosamax for a couple yrs which was stopped in 2015.  Prolia caused back pain. She had a compression fracture of T5 09/2020  on MRI   24-hr urinary cortisol was normal at 11 ug 12/2020  No prior history of cancer or radiation exposure   Forteo was cost prohibitive and was provided with patient assistance which was approved in May 2023   SUBJECTIVE:    Today (02/15/2022):  [name] is here for postablative hypothyroidism and osteoporosis .  She had a follow-up with pulmonology for OSA 01/24/2022 She was seen by cardiology for follow-up on A-fib 12/05/2021 She continues to follow-up with orthopedics for knee pain  Denies skin rash with injections  Ran out vitamin D for 2 weeks   She received intra-articular injections yesterday in the back  Denies nausea or diarrhea  Denies recent falls  She is having issues with orbitopathy  Denies palpitations  Has loose stools with metformin    Levothryoxine 125 mcg daily  Caltrate  600 mg BID  Vitamin D 1000 iu daily  Forteo 20 mcg daily    HISTORY:  Past Medical History:  Past Medical History:  Diagnosis Date   Anxiety    Arrhythmia    Asthma    Atrial fibrillation ([place])    Chronic sialoadenitis    Depression    Diabetes ([place])    Elevated cholesterol    History of colon polyps    Hypertension    Obesity    Sleep apnea    On CPAP Machine    Thyroid disease    Graves disease s/p Iodine ablation now with hypothyroidism.   Past Surgical History:  Past Surgical History:  Procedure Laterality Date   CARDIAC ELECTROPHYSIOLOGY MAPPING AND ABLATION     COLONOSCOPY  09/14/2014   Colonic polyps status post polypectomy. Mild sigmoid diverticulosis   EXCISION OF BREAST LESION  09/04/2007   Small fibroadenoma w/ coarse clustered microcalcification. Dr Amalia Hailey   eye decompression     ROTATOR CUFF REPAIR     SUBMANDIBULAR GLAND EXCISION Right 10/06/2001   Dr Melina Modena    Social History:  reports that she has quit smoking. She has never used smokeless tobacco. She reports current alcohol use. She reports that she does not use drugs. Family History:  Family History  Problem Relation Age of Onset   Prostate cancer Father    Diabetes Father    Colon cancer Cousin  1st cousin    Colon polyps Sister    Diabetes Sister    Irritable bowel syndrome Sister    Diabetes Brother    Congestive Heart Failure Mother    Esophageal cancer Neg Hx      HOME MEDICATIONS: Allergies as of 02/15/2022       Reactions   Aleve [naproxen Sodium] Swelling   Naproxen Swelling        Medication List        Accurate as of February 15, 2022  8:52 AM. If you have any questions, ask your nurse or doctor.          STOP taking these medications    Ozempic (0.25 or 0.5 MG/DOSE) 2 MG/3ML Sopn Generic drug: Semaglutide(0.25 or 0.'5MG'$ /DOS) Stopped by: Dorita Sciara, MD   triamcinolone acetonide 40 MG/ML Susp Commonly known as: TRIESENCE Stopped by: Dorita Sciara, MD       TAKE these medications    apixaban 5 MG Tabs tablet Commonly known as: ELIQUIS Take 5 mg by mouth 2 (two) times daily.   bupivacaine (PF) 0.25 % Soln injection Commonly known as: MARCAINE Inject into the skin.   busPIRone 10 MG tablet Commonly known as: BUSPAR Take 1 tablet by mouth 2 (two) times daily.   Contour Next Test test strip Generic drug: glucose blood 1 each daily.   diazepam 10 MG tablet Commonly known as: VALIUM Take 10 mg by mouth as needed.   flecainide 50 MG tablet Commonly known as: TAMBOCOR Take 50 mg by mouth 2 (two) times daily.   Forteo 600 MCG/2.4ML Sopn Generic drug: Teriparatide (Recombinant) Inject 20 mcg into the skin daily in the afternoon.   levothyroxine 125 MCG tablet Commonly known as: SYNTHROID Take 1 tablet (125 mcg total) by mouth daily.   lisinopril 40 MG tablet Commonly known as: ZESTRIL Take 40 mg by mouth daily.   metFORMIN 500 MG tablet Commonly known as: GLUCOPHAGE Take 1 tablet by mouth daily with breakfast.   metoprolol tartrate 25 MG tablet Commonly known as: LOPRESSOR Take 25 mg by mouth 2 (two) times daily.   mometasone-formoterol 200-5 MCG/ACT Aero Commonly known as: DULERA Inhale 2 puffs into the lungs daily. Every Morning   montelukast 10 MG tablet Commonly known as: SINGULAIR Take 10 mg by mouth daily.   omeprazole 40 MG capsule Commonly known as: PRILOSEC Take 40 mg by mouth daily.   oxyCODONE-acetaminophen 5-325 MG tablet Commonly known as: PERCOCET/ROXICET Take 1 tablet by mouth as needed.   pravastatin 20 MG tablet Commonly known as: PRAVACHOL Take 20 mg by mouth daily.          OBJECTIVE:   PHYSICAL EXAM: VS: BP 122/80 (BP Location: Left Arm, Patient Position: Sitting, Cuff Size: Large)   Pulse 61   Ht '5\' 5"'$  (1.651 m)   Wt 278 lb (126.1 kg)   SpO2 97%   BMI 46.26 kg/m    EXAM: General: Pt appears well and is in NAD  Lungs: Clear with good BS bilat with  no rales, rhonchi, or wheezes  Heart: Auscultation: RRR.  Abdomen: Normoactive bowel sounds, soft, nontender, without masses or organomegaly palpable  Extremities:  BL LE: No pretibial edema normal ROM and strength.  Mental Status: Judgment, insight: Intact Orientation: Oriented to time, place, and person Mood and affect: No depression, anxiety, or agitation     DATA REVIEWED:   Latest Reference Range & Units 02/15/22 09:23  Sodium 135 - 145 mEq/L 139  Potassium  3.5 - 5.1 mEq/L 4.7  Chloride 96 - 112 mEq/L 104  CO2 19 - 32 mEq/L 29  Glucose 70 - 99 mg/dL 129 (H)  BUN 6 - 23 mg/dL 20  Creatinine 0.40 - 1.20 mg/dL 0.72  Calcium 8.4 - 10.5 mg/dL 10.2  GFR >60.00 mL/min 86.38  VITD 30.00 - 100.00 ng/mL 32.87    Latest Reference Range & Units 02/15/22 09:23  TSH 0.35 - 5.50 uIU/mL 0.50       09/08/2020 TSH 0.134  Ant-TPO Abs 9 IU/L  TSI < 1.0 TRAB 1.16 IU/L ( 0.00-1.75)   CT orbits 10/21/2020  Bilateral proptosis, with improvement since 2014. See above measurements. Previously noted enlargement of the extraocular muscles in 2014 has improved. Muscles are now somewhat atrophic and fatty replaced with overall decreased size since 2014.     MRI 09/06/2020 @ [facility]  T5 superior endplate compression fracture with 50% vertebral body height    Results:  Lumbar spine L1-L4 Femoral neck (FN) 33% distal radius  T-score   - 0.8  LFN: -2.6  -1.3  Change in BMD from previous DXA test (%) n/a n/a n/a   ASSESSMENT / PLAN / RECOMMENDATIONS:   Postablative Hypothyroidism:  -Patient is clinically euthyroid -No local neck symptoms -TSH remains normal  Medications :  Continue levothyroxine 125 mcg daily      2. Graves' Orbitopathy :   - Pt with hx of right eye sx secondary to Graves' disease in 2014.   - She was seen by [facility] ( Dr. Sarajane Marek)   3.  Osteoporosis with T5 compression fracture:  -She has been diagnosed with osteoporosis  years ago, she was on Fosamax approximately for 2 years but she stopped it  in 2015 .  She has tried Prolia but that caused back pain - Forteo started 09/2021 though pt assistant  - Tolerating well  -Emphasized the importance of optimizing calcium and vitamin D intake  Medication Caltrate 600 mg twice daily Vitamin D3 1000 IU daily  Forteo 20 mcg daily   Follow-up in 6 months    [place], MD  [facility] Endocrin[facility]  [facility] [address] [place], [place] [address] Phone: [phone] FAX: [phone]      CC: Lowella Dandy, NP [place] [address] Phone: [phone]  Fax: [phone]   Return to Endocrinology clinic as below: Future Appointments  Date Time Provider [place]  02/15/2022  9:10 AM Tammera Engert, Melanie Crazier, MD [facility] None  02/18/2022  3:45 PM [place], Nena Alexander, DPM [facility]

## 2022-02-15 NOTE — Patient Instructions (Signed)

## 2022-02-18 ENCOUNTER — Ambulatory Visit (INDEPENDENT_AMBULATORY_CARE_PROVIDER_SITE_OTHER): Payer: Medicare Other

## 2022-02-18 ENCOUNTER — Ambulatory Visit: Payer: Medicare Other | Admitting: Podiatry

## 2022-02-18 DIAGNOSIS — M79674 Pain in right toe(s): Secondary | ICD-10-CM

## 2022-02-18 DIAGNOSIS — M79675 Pain in left toe(s): Secondary | ICD-10-CM | POA: Diagnosis not present

## 2022-02-18 DIAGNOSIS — M19079 Primary osteoarthritis, unspecified ankle and foot: Secondary | ICD-10-CM | POA: Diagnosis not present

## 2022-02-18 DIAGNOSIS — B351 Tinea unguium: Secondary | ICD-10-CM

## 2022-02-18 NOTE — Progress Notes (Signed)
  Subjective:  Patient ID: Molly Patton, female    DOB: [birth year],  MRN: [id number]  Chief Complaint  Patient presents with   Nail Problem    DFC. Patient check blood sugar.     66 y.o. female presents with the above complaint. History confirmed with patient. Patient presenting with pain related to dystrophic thickened elongated nails. Patient is unable to trim own nails related to nail dystrophy and/or mobility issues. Patient does  have a history of T2DM. Unknown last A1C. Also with pain in right midfoot dorsally. States pain with any pressure on the area.   Objective:  Physical Exam: warm, good capillary refill nail exam onychomycosis of the toenails, onycholysis, and dystrophic nails DP pulses palpable, PT pulses palpable, and protective sensation intact Left Foot:  Pain with palpation of nails due to elongation and dystrophic growth.  Right Foot: Pain with palpation of nails due to elongation and dystrophic growth. Pain with palpation right dorsal midfoot with palpable osseous prominence located at the area.  02/18/2022 XR right foot 3 views AP lateral oblique weightbearing Attention directed to the right dorsal midfoot there is noted to be osseous spurring present of the dorsal aspect of the navicular.  No fracture is identified.  Osteoarthritic changes of the TMT J and midtarsal joints.  Assessment:   1. Pain due to onychomycosis of toenails of both feet   2. Arthritis of midfoot      Plan:  Patient was evaluated and treated and all questions answered.  #Midfoot arthritis right foot, dorsal osseous spurs right midfoot -Discussed with the patient that based on the x-ray findings and her area of pain she likely has osseous spurring that is causing pressure on the nerve at the dorsal foot which is causing her discomfort. -I discussed that surgery could be considered to shave off some of this osseous spurring.  I did warn her however that we may not get full relief of her pain  if there is still arthritic changes in the joints that is causing some of her pain. -I will have patient follow-up in 4 weeks to further discuss this midfoot pain.  In the meantime I will order to try Voltaren gel and offloading skipping laces in her shoes to see if this reduces some of her pain without needing to proceed with surgical intervention. -Surgery in her case would consist of bone spur resection dorsal right midfoot.   #Onychomycosis with pain  -Nails palliatively debrided as below. -Educated on self-care  Procedure: Nail Debridement Rationale: Pain Type of Debridement: manual, sharp debridement. Instrumentation: Nail nipper, rotary burr. Number of Nails: 10  Return in about 4 weeks (around 03/18/2022) for Right midfoot arthritis.         Everitt Amber, DPM [facility]

## 2022-02-21 DIAGNOSIS — G8929 Other chronic pain: Secondary | ICD-10-CM | POA: Diagnosis not present

## 2022-02-21 DIAGNOSIS — M25561 Pain in right knee: Secondary | ICD-10-CM | POA: Diagnosis not present

## 2022-02-21 DIAGNOSIS — M25562 Pain in left knee: Secondary | ICD-10-CM | POA: Diagnosis not present

## 2022-02-25 NOTE — Telephone Encounter (Signed)
Patient dropped off [employer] application and updated financials.  Placed in MD box at front desk.

## 2022-02-26 ENCOUNTER — Telehealth: Payer: Self-pay

## 2022-02-26 NOTE — Telephone Encounter (Signed)
[facility] application has been faxed.

## 2022-04-11 DIAGNOSIS — M810 Age-related osteoporosis without current pathological fracture: Secondary | ICD-10-CM | POA: Diagnosis not present

## 2022-04-11 DIAGNOSIS — Z79899 Other long term (current) drug therapy: Secondary | ICD-10-CM | POA: Diagnosis not present

## 2022-04-11 DIAGNOSIS — M1990 Unspecified osteoarthritis, unspecified site: Secondary | ICD-10-CM | POA: Diagnosis not present

## 2022-04-11 DIAGNOSIS — F411 Generalized anxiety disorder: Secondary | ICD-10-CM | POA: Diagnosis not present

## 2022-04-11 DIAGNOSIS — E785 Hyperlipidemia, unspecified: Secondary | ICD-10-CM | POA: Diagnosis not present

## 2022-04-11 DIAGNOSIS — E1165 Type 2 diabetes mellitus with hyperglycemia: Secondary | ICD-10-CM | POA: Diagnosis not present

## 2022-04-11 DIAGNOSIS — Z23 Encounter for immunization: Secondary | ICD-10-CM | POA: Diagnosis not present

## 2022-04-12 ENCOUNTER — Ambulatory Visit (INDEPENDENT_AMBULATORY_CARE_PROVIDER_SITE_OTHER): Payer: BLUE CROSS/BLUE SHIELD | Admitting: Podiatry

## 2022-04-12 DIAGNOSIS — M19079 Primary osteoarthritis, unspecified ankle and foot: Secondary | ICD-10-CM | POA: Diagnosis not present

## 2022-04-12 DIAGNOSIS — M7751 Other enthesopathy of right foot: Secondary | ICD-10-CM

## 2022-04-12 NOTE — Progress Notes (Signed)
  Subjective:  Patient ID: Molly Patton, female    DOB: [birth year],  MRN: [id number]  Chief Complaint  Patient presents with   Consult    Surgery consult    67 y.o. female presents for follow up of right foot midfoot arthritis and bone spur that was causing pain. Discussed used of anti inflammatory gel.  Patient states she has a new pain that recently started in the forefoot in between the first and second toes at the webspace area.  She has pain with palpation there.  She says this occurred out of the blue after watching a movie.  Objective:  Physical Exam: warm, good capillary refill nail exam onychomycosis of the toenails, onycholysis, and dystrophic nails DP pulses palpable, PT pulses palpable, and protective sensation intact Left Foot:  Pain with palpation of nails due to elongation and dystrophic growth.  Right Foot: Pain with palpation of nails due to elongation and dystrophic growth. Pain with palpation right dorsal midfoot with palpable osseous prominence located at the area.  Pain with palpation of the first intermetatarsal space and along the course of the extensor houses longus tendon.  02/18/2022 XR right foot 3 views AP lateral oblique weightbearing Attention directed to the right dorsal midfoot there is noted to be osseous spurring present of the dorsal aspect of the navicular.  No fracture is identified.  Osteoarthritic changes of the TMT J and midtarsal joints.  Assessment:   1. Arthritis of midfoot   2. Tendinitis of right foot   3. Bone spur of right foot       Plan:  Patient was evaluated and treated and all questions answered.  #Midfoot arthritis right foot, dorsal osseous spurs right midfoot -Continue with topical anti-inflammatory medications and pain patches as needed -Will discuss further at next visit if she is not satisfied with the mild pain relief we would consider possibly resecting some of the spurs of the dorsal aspect of the right foot midfoot  however I am not sure this would fully alleviate her pain as I do believe a lot of her pain is related to osteoarthritis in the navicular cuneiform joint and talonavicular joints.  # Tendinitis of right foot pain in the first w intermetatarsal space, tendinitis adductor hallucis vs EDB -Recommend steroid injection.  Patient was agreeable. -Proceed with steroid injection of 1 cc half percent Marcaine plain with 1 cc Kenalog 10 after sterile alcohol swab.  Patient tolerated well adhesive bandage was applied. -Continue with good supportive shoes which the patient is doing.  Return in about 8 weeks (around 06/07/2022) for F/u R foot and RFC.         Everitt Amber, DPM [facility] [place] [facility]

## 2022-04-18 DIAGNOSIS — L74519 Primary focal hyperhidrosis, unspecified: Secondary | ICD-10-CM | POA: Diagnosis not present

## 2022-04-24 ENCOUNTER — Ambulatory Visit (INDEPENDENT_AMBULATORY_CARE_PROVIDER_SITE_OTHER): Payer: Medicare Other | Admitting: Gastroenterology

## 2022-04-24 ENCOUNTER — Encounter: Payer: Self-pay | Admitting: Gastroenterology

## 2022-04-24 ENCOUNTER — Other Ambulatory Visit (INDEPENDENT_AMBULATORY_CARE_PROVIDER_SITE_OTHER): Payer: Medicare Other

## 2022-04-24 VITALS — BP 132/68 | HR 97 | Ht 65.0 in | Wt 285.5 lb

## 2022-04-24 DIAGNOSIS — R197 Diarrhea, unspecified: Secondary | ICD-10-CM | POA: Diagnosis not present

## 2022-04-24 DIAGNOSIS — Z86010 Personal history of colonic polyps: Secondary | ICD-10-CM

## 2022-04-24 DIAGNOSIS — R1032 Left lower quadrant pain: Secondary | ICD-10-CM

## 2022-04-24 DIAGNOSIS — K625 Hemorrhage of anus and rectum: Secondary | ICD-10-CM

## 2022-04-24 LAB — COMPREHENSIVE METABOLIC PANEL
ALT: 14 U/L (ref 0–35)
AST: 13 U/L (ref 0–37)
Albumin: 4.3 g/dL (ref 3.5–5.2)
Alkaline Phosphatase: 66 U/L (ref 39–117)
BUN: 24 mg/dL — ABNORMAL HIGH (ref 6–23)
CO2: 27 mEq/L (ref 19–32)
Calcium: 10.7 mg/dL — ABNORMAL HIGH (ref 8.4–10.5)
Chloride: 104 mEq/L (ref 96–112)
Creatinine, Ser: 0.95 mg/dL (ref 0.40–1.20)
GFR: 61.85 mL/min (ref >60.00)
Glucose, Bld: 144 mg/dL — ABNORMAL HIGH (ref 70–99)
Potassium: 4.1 mEq/L (ref 3.5–5.1)
Sodium: 139 mEq/L (ref 135–145)
Total Bilirubin: 0.3 mg/dL (ref 0.2–1.2)
Total Protein: 7.5 g/dL (ref 6.0–8.3)

## 2022-04-24 LAB — CBC WITH DIFFERENTIAL/PLATELET
Basophils Absolute: 0.1 10*3/uL (ref 0.0–0.1)
Basophils Relative: 1.2 % (ref 0.0–3.0)
Eosinophils Absolute: 0.2 10*3/uL (ref 0.0–0.7)
Eosinophils Relative: 1.8 % (ref 0.0–5.0)
HCT: 39.9 % (ref 36.0–46.0)
Hemoglobin: 13.3 g/dL (ref 12.0–15.0)
Lymphocytes Relative: 26.7 % (ref 12.0–46.0)
Lymphs Abs: 2.3 10*3/uL (ref 0.7–4.0)
MCHC: 33.3 g/dL (ref 30.0–36.0)
MCV: 92.6 fl (ref 78.0–100.0)
Monocytes Absolute: 0.7 10*3/uL (ref 0.1–1.0)
Monocytes Relative: 7.9 % (ref 3.0–12.0)
Neutro Abs: 5.3 10*3/uL (ref 1.4–7.7)
Neutrophils Relative %: 62.4 % (ref 43.0–77.0)
Platelets: 287 10*3/uL (ref 150.0–400.0)
RBC: 4.31 Mil/uL (ref 3.87–5.11)
RDW: 15 % (ref 11.5–15.5)
WBC: 8.5 10*3/uL (ref 4.0–10.5)

## 2022-04-24 LAB — C-REACTIVE PROTEIN: CRP: 7.6 mg/dL (ref 0.5–20.0)

## 2022-04-24 MED ORDER — DICYCLOMINE HCL 10 MG PO CAPS: 10.0000 mg | ORAL_CAPSULE | Freq: Two times a day (BID) | ORAL | 3 refills | Status: AC

## 2022-04-24 NOTE — Patient Instructions (Addendum)
_______________________________________________________  If your blood pressure at your visit was 140/90 or greater, please contact your primary care physician to follow up on this.  _______________________________________________________  If you are age 68 or older, your body mass index should be between 23-30. Your Body mass index is 47.51 kg/m. If this is out of the aforementioned range listed, please consider follow up with your Primary Care Provider.  If you are age 46 or younger, your body mass index should be between 19-25. Your Body mass index is 47.51 kg/m. If this is out of the aformentioned range listed, please consider follow up with your Primary Care Provider.   ________________________________________________________  The [place] [facility] providers would like to encourage you to use [facility] to communicate with providers for non-urgent requests or questions.  Due to long hold times on the telephone, sending your provider a message by [facility] may be a faster and more efficient way to get a response.  Please allow 48 business hours for a response.  Please remember that this is for non-urgent requests.  _______________________________________________________  Your provider has requested that you go to the basement level for lab work before leaving today. Press "B" on the elevator. The lab is located at the first door on the left as you exit the elevator.  We have sent the following medications to your pharmacy for you to pick up at your convenience: Bentyl  Repeat colonoscopy for July 2024. Please call 2 months prior to schedule this. A letter will be sent as it gets closer. You will need a Eliquis clearance.  You have been scheduled for a CT scan of the abdomen and pelvis at [facility] ([address], [place], [place] [address]).   You are scheduled on 05-01-2022 at 530pm. You should arrive 3:15pm at to your appointment time for registration. Please follow the written  instructions below on the day of your exam:  WARNING: IF YOU ARE ALLERGIC TO IODINE/X-RAY DYE, PLEASE NOTIFY RADIOLOGY IMMEDIATELY AT [phone]! YOU WILL BE GIVEN A 13 HOUR PREMEDICATION PREP.  1) Do not eat or drink anything after 130pm (4 hours prior to your test) 2) You have been given 2 bottles of oral contrast to drink. The solution may taste better if refrigerated, but do NOT add ice or any other liquid to this solution. Shake well before drinking.    Drink 1 bottle of contrast @ 330pm (2 hours prior to your exam)  Drink 1 bottle of contrast @ 430pm (1 hour prior to your exam)  You may take any medications as prescribed with a small amount of water, if necessary. If you take any of the following medications: METFORMIN, GLUCOPHAGE, GLUCOVANCE, AVANDAMET, RIOMET, FORTAMET, [place] MET, JANUMET, GLUMETZA or METAGLIP, you MAY be asked to HOLD this medication 48 hours AFTER the exam.  The purpose of you drinking the oral contrast is to aid in the visualization of your intestinal tract. The contrast solution may cause some diarrhea. Depending on your individual set of symptoms, you may also receive an intravenous injection of x-ray contrast/dye. Plan on being at [facility] for 30 minutes or longer, depending on the type of exam you are having performed.  This test typically takes 30-45 minutes to complete.  If you have any questions regarding your exam or if you need to reschedule, you may call the CT department at [phone] between the hours of 8:00 am and 5:00 pm, Monday-Friday.  ________________________________________________________________________

## 2022-04-24 NOTE — Progress Notes (Signed)
Chief Complaint: for colon  Referring Provider:  Lowella Dandy, NP      ASSESSMENT AND PLAN;   #1.  LLQ pain  #2.  IBS-D. Diarrhea could be d/t IBS/meds like metformin.   #2.  H/O colon polyps. Next colon due 10/2022  #3.  Comorbid conditions include A. fib on Eliquis, DM2, HLD, HTN, OSA on CPAP  Plan: -CBC, CMP, CRP -CT AP with contrast. -Bentyl '10mg'$  po BID #90. -Colon 10/2022 after cardio clearence and holding eliquis x 1 day.   HPI:    Molly Patton is a 67 y.o. female  With H/O A. fib on Eliquis, DM2, HLD, HTN, OSA on CPAP, anxiety  LLQ pain- x 6 months, sharp, occasionally relieved by defecation.  At times constant.  Also associated with back pain.  No fever or chills.  Tender to touch.  With intermittent diarrhea  3/day (only when she takes metformin).  No blood.  Diarrhea more prominent ever since she has been on Metformin.  It does not occur when she takes Metformin at night.  She denies having any abdominal pain but does complain of abdominal bloating.   Denies having any upper GI symptoms including heartburn, nausea, vomiting, odynophagia or dysphagia.  No fever chills or night sweats.  Wt Readings from Last 3 Encounters:  04/24/22 285 lb 8 oz (129.5 kg)  02/15/22 278 lb (126.1 kg)  08/10/21 273 lb 3.2 oz (123.9 kg)     Past GI procedures: Colon 10/2019 -Colon polyps s/p polypectomy. Bx- TA, SSA -Mild sig div -Neg random Bx -Rpt 3 yrs.   -Colonoscopy 09/2014 ([facility]) significant colonic polyps s/p polypectomy, mild sigmoid diverticulosis.  Recommended to repeat in 3 years.  History of colonic polyps 2009  SH-widowed, has 2 boys and 1 girl. Past Medical History:  Diagnosis Date   Anxiety    Arrhythmia    Asthma    Atrial fibrillation (HCC)    Chronic sialoadenitis    Depression    Diabetes (HCC)    Elevated cholesterol    History of colon polyps    Hypertension    Obesity    Sleep apnea    On CPAP Machine    Thyroid disease    Graves disease  s/p Iodine ablation now with hypothyroidism.    Past Surgical History:  Procedure Laterality Date   CARDIAC ELECTROPHYSIOLOGY MAPPING AND ABLATION     COLONOSCOPY  09/14/2014   Colonic polyps status post polypectomy. Mild sigmoid diverticulosis   EXCISION OF BREAST LESION  09/04/2007   Small fibroadenoma w/ coarse clustered microcalcification. Dr Amalia Hailey   eye decompression     ROTATOR CUFF REPAIR     SUBMANDIBULAR GLAND EXCISION Right 10/06/2001   Dr Melina Modena     Family History  Problem Relation Age of Onset   Prostate cancer Father    Diabetes Father    Colon cancer Cousin        1st cousin    Colon polyps Sister    Diabetes Sister    Irritable bowel syndrome Sister    Diabetes Brother    Congestive Heart Failure Mother    Esophageal cancer Neg Hx     Social History   Tobacco Use   Smoking status: Former   Smokeless tobacco: Never   Tobacco comments:    quit 30's years ago  Vaping Use   Vaping Use: Never used  Substance Use Topics   Alcohol use: Not Currently    Comment: ocassionally  Drug use: Never    Current Outpatient Medications  Medication Sig Dispense Refill   apixaban (ELIQUIS) 5 MG TABS tablet Take 5 mg by mouth 2 (two) times daily.     busPIRone (BUSPAR) 10 MG tablet Take 1 tablet by mouth 2 (two) times daily.     CONTOUR NEXT TEST test strip 1 each daily.     diazepam (VALIUM) 10 MG tablet Take 10 mg by mouth as needed.      flecainide (TAMBOCOR) 50 MG tablet Take 50 mg by mouth 2 (two) times daily.     levothyroxine (SYNTHROID) 125 MCG tablet Take 1 tablet (125 mcg total) by mouth daily. 90 tablet 3   lisinopril (PRINIVIL,ZESTRIL) 40 MG tablet Take 40 mg by mouth daily.     metFORMIN (GLUCOPHAGE) 500 MG tablet Take 1 tablet by mouth daily with breakfast.     metoprolol tartrate (LOPRESSOR) 25 MG tablet Take 25 mg by mouth 2 (two) times daily.     mometasone-formoterol (DULERA) 200-5 MCG/ACT AERO Inhale 2 puffs into the lungs daily. Every Morning      montelukast (SINGULAIR) 10 MG tablet Take 10 mg by mouth daily.     omeprazole (PRILOSEC) 40 MG capsule Take 40 mg by mouth daily.     oxyCODONE-acetaminophen (PERCOCET/ROXICET) 5-325 MG tablet Take 1 tablet by mouth as needed.      pravastatin (PRAVACHOL) 20 MG tablet Take 20 mg by mouth daily.     Teriparatide, Recombinant, (FORTEO) 600 MCG/2.4ML SOPN Inject 20 mcg into the skin daily in the afternoon. 7.2 mL 3   Current Facility-Administered Medications  Medication Dose Route Frequency Provider Last Rate Last Admin   dexamethasone (DECADRON) injection 4 mg  4 mg Intra-articular Once Lorenda Peck, DPM        Allergies  Allergen Reactions   Aleve [Naproxen Sodium] Swelling   Naproxen Swelling    Review of Systems:  Constitutional: Denies fever, chills, diaphoresis, appetite change and has fatigue.  HEENT: Has allergies Respiratory: Denies SOB, DOE, cough, chest tightness,  and wheezing.   Cardiovascular: Denies chest pain, palpitations and leg swelling.  Genitourinary: Denies dysuria, urgency, frequency, hematuria, flank pain and difficulty urinating.  Musculoskeletal: Has myalgias, back pain, joint swelling, arthralgias and gait problem.  Skin: No rash.  Neurological: Denies dizziness, seizures, syncope, weakness, light-headedness, numbness and headaches.  Hematological: Denies adenopathy. Easy bruising, personal or family bleeding history  Psychiatric/Behavioral: Has anxiety or depression     Physical Exam:    Ht '5\' 5"'$  (1.651 m)   Wt 285 lb 8 oz (129.5 kg)   BMI 47.51 kg/m  Wt Readings from Last 3 Encounters:  04/24/22 285 lb 8 oz (129.5 kg)  02/15/22 278 lb (126.1 kg)  08/10/21 273 lb 3.2 oz (123.9 kg)   Constitutional:  Well-developed, in no acute distress. Psychiatric: Normal mood and affect. Behavior is normal. HEENT: Pupils normal.  Conjunctivae are normal. No scleral icterus. Cardiovascular: Normal rate, regular rhythm. No edema Pulmonary/chest: Effort normal  and breath sounds normal. No wheezing, rales or rhonchi. Abdominal: Soft, nondistended. Nontender. Bowel sounds active throughout. There are no masses palpable. No hepatomegaly. LLQ mild tenderness. No rebound Neurological: Alert and oriented to person place and time. Skin: Skin is warm and dry. No rashes noted.  Data Reviewed: I have personally reviewed following labs and imaging studies  CBC:    Latest Ref Rng & Units 04/07/2008    7:37 AM  CBC  WBC 4.0 - 10.5 K/uL 8.4   Hemoglobin  12.0 - 15.0 g/dL 13.5   Hematocrit 36.0 - 46.0 % 41.1   Platelets 150 - 400 K/uL 303     CMP:    Latest Ref Rng & Units 02/15/2022    9:23 AM 08/17/2021    8:37 AM 12/08/2020    9:27 AM  CMP  Glucose 70 - 99 mg/dL 129  106  127   BUN 6 - 23 mg/dL '20  28  18   '$ Creatinine 0.40 - 1.20 mg/dL 0.72  0.97  0.94   Sodium 135 - 145 mEq/L 139  140  141   Potassium 3.5 - 5.1 mEq/L 4.7  4.5  3.9   Chloride 96 - 112 mEq/L 104  104  104   CO2 19 - 32 mEq/L '29  25  27   '$ Calcium 8.4 - 10.5 mg/dL 10.2  9.4  9.8   Total Protein 6.0 - 8.3 g/dL   7.1   Total Bilirubin 0.2 - 1.2 mg/dL   0.4   Alkaline Phos 39 - 117 U/L   55   AST 0 - 37 U/L   15   ALT 0 - 35 U/L   18         Carmell Austria, MD 04/24/2022, 3:46 PM  Cc: Lowella Dandy, NP

## 2022-05-01 ENCOUNTER — Ambulatory Visit (HOSPITAL_COMMUNITY)
Admission: RE | Admit: 2022-05-01 | Discharge: 2022-05-01 | Disposition: A | Discharge status: Home / Self Care | Payer: Medicare Other | Source: Ambulatory Visit | Attending: Gastroenterology | Admitting: Gastroenterology

## 2022-05-01 ENCOUNTER — Encounter (HOSPITAL_COMMUNITY): Payer: Self-pay

## 2022-05-01 DIAGNOSIS — R109 Unspecified abdominal pain: Secondary | ICD-10-CM | POA: Diagnosis not present

## 2022-05-01 DIAGNOSIS — Z860100 Personal history of colon polyps, unspecified: Secondary | ICD-10-CM

## 2022-05-01 DIAGNOSIS — R1032 Left lower quadrant pain: Secondary | ICD-10-CM

## 2022-05-01 DIAGNOSIS — K625 Hemorrhage of anus and rectum: Secondary | ICD-10-CM | POA: Diagnosis not present

## 2022-05-01 DIAGNOSIS — R197 Diarrhea, unspecified: Secondary | ICD-10-CM

## 2022-05-01 MED ORDER — IOHEXOL 300 MG/ML  SOLN
100.0000 mL | Freq: Once | INTRAMUSCULAR | Status: AC | PRN
  Administered 2022-05-01: 100 mL via INTRAVENOUS

## 2022-05-14 ENCOUNTER — Telehealth: Payer: Self-pay | Admitting: Pharmacy Technician

## 2022-05-14 NOTE — Telephone Encounter (Signed)
Patient Advocate Encounter  Received notification from [facility] that prior authorization for DICYCLOMINE '10MG'$  is required.   PA submitted on 2.6.24 Key [id number] Status is pending

## 2022-05-15 NOTE — Telephone Encounter (Signed)
Inbound call from [employer], Dicyclomine has been approved from 05/14/22-05/15/23. Stated if practice had anymore questions to call.   [phone]  Option 5

## 2022-05-22 DIAGNOSIS — I480 Paroxysmal atrial fibrillation: Secondary | ICD-10-CM | POA: Diagnosis not present

## 2022-05-22 DIAGNOSIS — J449 Chronic obstructive pulmonary disease, unspecified: Secondary | ICD-10-CM | POA: Diagnosis not present

## 2022-05-22 DIAGNOSIS — E7800 Pure hypercholesterolemia, unspecified: Secondary | ICD-10-CM | POA: Diagnosis not present

## 2022-05-22 DIAGNOSIS — G4733 Obstructive sleep apnea (adult) (pediatric): Secondary | ICD-10-CM | POA: Diagnosis not present

## 2022-05-22 NOTE — Telephone Encounter (Signed)
Patient Advocate Encounter  Prior Authorization for DICYCLOMINE 10MG has been approved.    PA# --- Effective dates: 2.6.24 through 2.6.25

## 2022-05-23 DIAGNOSIS — M170 Bilateral primary osteoarthritis of knee: Secondary | ICD-10-CM | POA: Diagnosis not present

## 2022-06-04 DIAGNOSIS — I10 Essential (primary) hypertension: Secondary | ICD-10-CM | POA: Diagnosis not present

## 2022-06-04 DIAGNOSIS — R5381 Other malaise: Secondary | ICD-10-CM | POA: Diagnosis not present

## 2022-06-04 DIAGNOSIS — R5383 Other fatigue: Secondary | ICD-10-CM | POA: Diagnosis not present

## 2022-06-04 DIAGNOSIS — I440 Atrioventricular block, first degree: Secondary | ICD-10-CM | POA: Diagnosis not present

## 2022-06-04 DIAGNOSIS — G4733 Obstructive sleep apnea (adult) (pediatric): Secondary | ICD-10-CM | POA: Diagnosis not present

## 2022-06-04 DIAGNOSIS — Z7901 Long term (current) use of anticoagulants: Secondary | ICD-10-CM | POA: Diagnosis not present

## 2022-06-04 DIAGNOSIS — Z6841 Body Mass Index (BMI) 40.0 and over, adult: Secondary | ICD-10-CM | POA: Diagnosis not present

## 2022-06-04 DIAGNOSIS — I480 Paroxysmal atrial fibrillation: Secondary | ICD-10-CM | POA: Diagnosis not present

## 2022-06-07 ENCOUNTER — Ambulatory Visit: Payer: Medicare Other | Admitting: Podiatry

## 2022-06-07 DIAGNOSIS — Z6841 Body Mass Index (BMI) 40.0 and over, adult: Secondary | ICD-10-CM | POA: Diagnosis not present

## 2022-06-07 DIAGNOSIS — B9620 Unspecified Escherichia coli [E. coli] as the cause of diseases classified elsewhere: Secondary | ICD-10-CM | POA: Diagnosis not present

## 2022-06-07 DIAGNOSIS — N3000 Acute cystitis without hematuria: Secondary | ICD-10-CM | POA: Diagnosis not present

## 2022-06-07 DIAGNOSIS — R109 Unspecified abdominal pain: Secondary | ICD-10-CM | POA: Diagnosis not present

## 2022-06-07 DIAGNOSIS — E860 Dehydration: Secondary | ICD-10-CM | POA: Diagnosis not present

## 2022-06-07 DIAGNOSIS — I959 Hypotension, unspecified: Secondary | ICD-10-CM | POA: Diagnosis not present

## 2022-06-07 DIAGNOSIS — N179 Acute kidney failure, unspecified: Secondary | ICD-10-CM | POA: Diagnosis not present

## 2022-06-07 DIAGNOSIS — I4891 Unspecified atrial fibrillation: Secondary | ICD-10-CM | POA: Diagnosis not present

## 2022-06-07 DIAGNOSIS — G9341 Metabolic encephalopathy: Secondary | ICD-10-CM | POA: Diagnosis not present

## 2022-06-07 DIAGNOSIS — R42 Dizziness and giddiness: Secondary | ICD-10-CM | POA: Diagnosis not present

## 2022-06-07 DIAGNOSIS — R1111 Vomiting without nausea: Secondary | ICD-10-CM | POA: Diagnosis not present

## 2022-06-07 DIAGNOSIS — I10 Essential (primary) hypertension: Secondary | ICD-10-CM | POA: Diagnosis not present

## 2022-06-07 DIAGNOSIS — A080 Rotaviral enteritis: Secondary | ICD-10-CM | POA: Diagnosis not present

## 2022-06-07 DIAGNOSIS — J45909 Unspecified asthma, uncomplicated: Secondary | ICD-10-CM | POA: Diagnosis not present

## 2022-06-07 DIAGNOSIS — N390 Urinary tract infection, site not specified: Secondary | ICD-10-CM | POA: Diagnosis not present

## 2022-06-07 DIAGNOSIS — A419 Sepsis, unspecified organism: Secondary | ICD-10-CM | POA: Diagnosis not present

## 2022-06-07 DIAGNOSIS — R079 Chest pain, unspecified: Secondary | ICD-10-CM | POA: Diagnosis not present

## 2022-06-07 DIAGNOSIS — G4733 Obstructive sleep apnea (adult) (pediatric): Secondary | ICD-10-CM | POA: Diagnosis not present

## 2022-06-07 DIAGNOSIS — R110 Nausea: Secondary | ICD-10-CM | POA: Diagnosis not present

## 2022-06-07 DIAGNOSIS — R000 Tachycardia, unspecified: Secondary | ICD-10-CM | POA: Diagnosis not present

## 2022-06-07 DIAGNOSIS — M1990 Unspecified osteoarthritis, unspecified site: Secondary | ICD-10-CM | POA: Diagnosis not present

## 2022-06-07 DIAGNOSIS — R6521 Severe sepsis with septic shock: Secondary | ICD-10-CM | POA: Diagnosis not present

## 2022-06-13 DIAGNOSIS — J4540 Moderate persistent asthma, uncomplicated: Secondary | ICD-10-CM | POA: Diagnosis not present

## 2022-06-13 DIAGNOSIS — N179 Acute kidney failure, unspecified: Secondary | ICD-10-CM | POA: Diagnosis not present

## 2022-06-13 DIAGNOSIS — F418 Other specified anxiety disorders: Secondary | ICD-10-CM | POA: Diagnosis not present

## 2022-06-13 DIAGNOSIS — R6521 Severe sepsis with septic shock: Secondary | ICD-10-CM | POA: Diagnosis not present

## 2022-06-13 DIAGNOSIS — G4734 Idiopathic sleep related nonobstructive alveolar hypoventilation: Secondary | ICD-10-CM | POA: Diagnosis not present

## 2022-06-13 DIAGNOSIS — M5136 Other intervertebral disc degeneration, lumbar region: Secondary | ICD-10-CM | POA: Diagnosis not present

## 2022-06-13 DIAGNOSIS — E559 Vitamin D deficiency, unspecified: Secondary | ICD-10-CM | POA: Diagnosis not present

## 2022-06-13 DIAGNOSIS — I10 Essential (primary) hypertension: Secondary | ICD-10-CM | POA: Diagnosis not present

## 2022-06-13 DIAGNOSIS — M5020 Other cervical disc displacement, unspecified cervical region: Secondary | ICD-10-CM | POA: Diagnosis not present

## 2022-06-13 DIAGNOSIS — Z6841 Body Mass Index (BMI) 40.0 and over, adult: Secondary | ICD-10-CM | POA: Diagnosis not present

## 2022-06-13 DIAGNOSIS — A4151 Sepsis due to Escherichia coli [E. coli]: Secondary | ICD-10-CM | POA: Diagnosis not present

## 2022-06-13 DIAGNOSIS — B9789 Other viral agents as the cause of diseases classified elsewhere: Secondary | ICD-10-CM | POA: Diagnosis not present

## 2022-06-13 DIAGNOSIS — E1165 Type 2 diabetes mellitus with hyperglycemia: Secondary | ICD-10-CM | POA: Diagnosis not present

## 2022-06-13 DIAGNOSIS — A419 Sepsis, unspecified organism: Secondary | ICD-10-CM | POA: Diagnosis not present

## 2022-06-13 DIAGNOSIS — N3000 Acute cystitis without hematuria: Secondary | ICD-10-CM | POA: Diagnosis not present

## 2022-06-13 DIAGNOSIS — I480 Paroxysmal atrial fibrillation: Secondary | ICD-10-CM | POA: Diagnosis not present

## 2022-06-18 DIAGNOSIS — A419 Sepsis, unspecified organism: Secondary | ICD-10-CM | POA: Diagnosis not present

## 2022-06-18 DIAGNOSIS — E1165 Type 2 diabetes mellitus with hyperglycemia: Secondary | ICD-10-CM | POA: Diagnosis not present

## 2022-06-18 DIAGNOSIS — Z79899 Other long term (current) drug therapy: Secondary | ICD-10-CM | POA: Diagnosis not present

## 2022-06-18 DIAGNOSIS — I10 Essential (primary) hypertension: Secondary | ICD-10-CM | POA: Diagnosis not present

## 2022-06-18 DIAGNOSIS — E876 Hypokalemia: Secondary | ICD-10-CM | POA: Diagnosis not present

## 2022-06-18 DIAGNOSIS — R6520 Severe sepsis without septic shock: Secondary | ICD-10-CM | POA: Diagnosis not present

## 2022-07-02 DIAGNOSIS — J45909 Unspecified asthma, uncomplicated: Secondary | ICD-10-CM | POA: Diagnosis not present

## 2022-07-02 DIAGNOSIS — I4891 Unspecified atrial fibrillation: Secondary | ICD-10-CM | POA: Diagnosis not present

## 2022-07-08 DIAGNOSIS — D6869 Other thrombophilia: Secondary | ICD-10-CM | POA: Diagnosis not present

## 2022-07-08 DIAGNOSIS — F411 Generalized anxiety disorder: Secondary | ICD-10-CM | POA: Diagnosis not present

## 2022-07-08 DIAGNOSIS — J449 Chronic obstructive pulmonary disease, unspecified: Secondary | ICD-10-CM | POA: Diagnosis not present

## 2022-07-08 DIAGNOSIS — G4733 Obstructive sleep apnea (adult) (pediatric): Secondary | ICD-10-CM | POA: Diagnosis not present

## 2022-08-12 DIAGNOSIS — E1165 Type 2 diabetes mellitus with hyperglycemia: Secondary | ICD-10-CM | POA: Diagnosis not present

## 2022-08-12 DIAGNOSIS — I480 Paroxysmal atrial fibrillation: Secondary | ICD-10-CM | POA: Diagnosis not present

## 2022-08-12 DIAGNOSIS — E785 Hyperlipidemia, unspecified: Secondary | ICD-10-CM | POA: Diagnosis not present

## 2022-08-12 DIAGNOSIS — M1990 Unspecified osteoarthritis, unspecified site: Secondary | ICD-10-CM | POA: Diagnosis not present

## 2022-08-12 DIAGNOSIS — F411 Generalized anxiety disorder: Secondary | ICD-10-CM | POA: Diagnosis not present

## 2022-08-16 ENCOUNTER — Ambulatory Visit (INDEPENDENT_AMBULATORY_CARE_PROVIDER_SITE_OTHER): Payer: Medicare Other | Admitting: Internal Medicine

## 2022-08-16 ENCOUNTER — Telehealth: Payer: Self-pay | Admitting: Internal Medicine

## 2022-08-16 ENCOUNTER — Encounter: Payer: Self-pay | Admitting: Internal Medicine

## 2022-08-16 VITALS — BP 114/76 | HR 60 | Ht 65.0 in | Wt 278.0 lb

## 2022-08-16 DIAGNOSIS — E890 Postprocedural hypothyroidism: Secondary | ICD-10-CM

## 2022-08-16 DIAGNOSIS — Z8639 Personal history of other endocrine, nutritional and metabolic disease: Secondary | ICD-10-CM

## 2022-08-16 DIAGNOSIS — Z8781 Personal history of (healed) traumatic fracture: Secondary | ICD-10-CM | POA: Diagnosis not present

## 2022-08-16 DIAGNOSIS — M8000XS Age-related osteoporosis with current pathological fracture, unspecified site, sequela: Secondary | ICD-10-CM | POA: Diagnosis not present

## 2022-08-16 DIAGNOSIS — M810 Age-related osteoporosis without current pathological fracture: Secondary | ICD-10-CM | POA: Diagnosis not present

## 2022-08-16 DIAGNOSIS — S22050S Wedge compression fracture of T5-T6 vertebra, sequela: Secondary | ICD-10-CM

## 2022-08-16 HISTORY — DX: Morbid (severe) obesity due to excess calories: E66.01

## 2022-08-16 LAB — ALBUMIN: Albumin: 4.2 g/dL (ref 3.5–5.2)

## 2022-08-16 LAB — TSH: TSH: 2.34 u[IU]/mL (ref 0.35–5.50)

## 2022-08-16 LAB — VITAMIN D 25 HYDROXY (VIT D DEFICIENCY, FRACTURES): VITD: 28.17 ng/mL — ABNORMAL LOW (ref 30.00–100.00)

## 2022-08-16 NOTE — Progress Notes (Unsigned)
Name: Molly Patton  MRN/ DOB: [id number], 12-14-1955    Age/ Sex: 67 y.o., female     PCP: Hurshel Party, NP   Reason for Endocrinology Evaluation: Postablative hypothyroidism     Initial Endocrinology Clinic Visit: 07/03/2020    PATIENT IDENTIFIER: Molly Patton is a 67 y.o., female with a past medical history of Asthma, A. Fib, HTN, OSA , T2DM and Hx of Graves' disease . She has followed with [facility] since 07/03/2020 for consultative assistance with management of her postablative hypothyroidism.      HISTORICAL SUMMARY:  She has been diagnosed with Grave's disease followed by RAI ablation in 2005. She has been on desiccated thyroid for years.   Has hx of right exophthalmos surgery in 2014    She was c/o hair loss and other non-specific symptoms, we switched her from NP thyroid to Levothyroxine in 06/2020  Mother and daughter with thyroid disease    OSTEOPOROSIS HISTORY : She has a hx of osteoporosis years ago, was on Fosamax for a couple yrs which was stopped in 2015.  Prolia caused back pain. She had a compression fracture of T5 09/2020  on MRI   24-hr urinary cortisol was normal at 11 ug 12/2020  No prior history of cancer or radiation exposure   Forteo was cost prohibitive and was provided with patient assistance which was approved in May 2023      SUBJECTIVE:    Today (08/16/2022):  [name] is here for postablative hypothyroidism and osteoporosis .   She continues to follow-up with cardiology for A-fib, with prior cryoballoon ablation She was evaluated by [facility] for bilateral knee pain, working on losing weight to qualify for surgery, received intra-articular injections 05/2022 She has been following up with GI for IBS-diarrhea Had a follow-up with podiatry for arthritis of the midfoot 04/2022, received glucocorticoid injection  She was not taking vitamin D every day but recent daily intake  She had a recent  [place] virus infection requiring ED visit  She had recent falls No bone fractures Has rare  palpitations  Denies local neck swelling   She had reduced calcium to every other day after she noted elevated calcium   Levothryoxine 125 mcg daily  Caltrate 600 mg BID  Vitamin D 1000 iu daily  Forteo 20 mcg daily    HISTORY:  Past Medical History:  Past Medical History:  Diagnosis Date   Anxiety    Arrhythmia    Asthma    Atrial fibrillation (HCC)    Chronic sialoadenitis    Depression    Diabetes (HCC)    Elevated cholesterol    History of colon polyps    Hypertension    Obesity    Sleep apnea    On CPAP Machine    Thyroid disease    Graves disease s/p Iodine ablation now with hypothyroidism.   Past Surgical History:  Past Surgical History:  Procedure Laterality Date   CARDIAC ELECTROPHYSIOLOGY MAPPING AND ABLATION     COLONOSCOPY  09/14/2014   Colonic polyps status post polypectomy. Mild sigmoid diverticulosis   EXCISION OF BREAST LESION  09/04/2007   Small fibroadenoma w/ coarse clustered microcalcification. Dr Logan Bores   eye decompression     ROTATOR CUFF REPAIR     SUBMANDIBULAR GLAND EXCISION Right 10/06/2001   Dr Shelva Majestic    Social History:  reports that she has quit smoking. She has never used smokeless tobacco. She reports that she does  not currently use alcohol. She reports that she does not use drugs. Family History:  Family History  Problem Relation Age of Onset   Prostate cancer Father    Diabetes Father    Colon cancer Cousin        1st cousin    Colon polyps Sister    Diabetes Sister    Irritable bowel syndrome Sister    Diabetes Brother    Congestive Heart Failure Mother    Esophageal cancer Neg Hx      HOME MEDICATIONS: Allergies as of 08/16/2022       Reactions   Aleve [naproxen Sodium] Swelling   Naproxen Swelling        Medication List        Accurate as of Aug 16, 2022  8:08 AM. If you have any questions, ask your nurse or doctor.           apixaban 5 MG Tabs tablet Commonly known as: ELIQUIS Take 5 mg by mouth 2 (two) times daily.   busPIRone 10 MG tablet Commonly known as: BUSPAR Take 1 tablet by mouth 2 (two) times daily.   Contour Next Test test strip Generic drug: glucose blood 1 each daily.   diazepam 10 MG tablet Commonly known as: VALIUM Take 10 mg by mouth as needed.   dicyclomine 10 MG capsule Commonly known as: BENTYL Take 1 capsule (10 mg total) by mouth 2 (two) times daily.   flecainide 50 MG tablet Commonly known as: TAMBOCOR Take 50 mg by mouth 2 (two) times daily.   Forteo 600 MCG/2.4ML Sopn Generic drug: Teriparatide (Recombinant) Inject 20 mcg into the skin daily in the afternoon.   levothyroxine 125 MCG tablet Commonly known as: SYNTHROID Take 1 tablet (125 mcg total) by mouth daily.   lisinopril 40 MG tablet Commonly known as: ZESTRIL Take 40 mg by mouth daily.   metFORMIN 500 MG tablet Commonly known as: GLUCOPHAGE Take 1 tablet by mouth daily with breakfast.   metoprolol tartrate 25 MG tablet Commonly known as: LOPRESSOR Take 25 mg by mouth 2 (two) times daily.   mometasone-formoterol 200-5 MCG/ACT Aero Commonly known as: DULERA Inhale 2 puffs into the lungs daily. Every Morning   montelukast 10 MG tablet Commonly known as: SINGULAIR Take 10 mg by mouth daily.   omeprazole 40 MG capsule Commonly known as: PRILOSEC Take 40 mg by mouth daily.   oxyCODONE-acetaminophen 5-325 MG tablet Commonly known as: PERCOCET/ROXICET Take 1 tablet by mouth as needed.   pravastatin 20 MG tablet Commonly known as: PRAVACHOL Take 20 mg by mouth daily.          OBJECTIVE:   PHYSICAL EXAM: VS: BP 114/76 (BP Location: Left Arm, Patient Position: Sitting, Cuff Size: Large)   Pulse 60   Ht 5\' 5"  (1.651 m)   Wt 278 lb (126.1 kg)   SpO2 95%   BMI 46.26 kg/m    EXAM: General: Pt appears well and is in NAD  Lungs: Clear with good BS bilat with no rales, rhonchi, or  wheezes  Heart: Auscultation: RRR.  Abdomen: Normoactive bowel sounds, soft, nontender, without masses or organomegaly palpable  Extremities:  BL LE: No pretibial edema normal ROM and strength.  Mental Status: Judgment, insight: Intact Orientation: Oriented to time, place, and person Mood and affect: No depression, anxiety, or agitation     DATA REVIEWED: 08/12/2022 GFR 77 BUN 19 Cr 0.830  06/04/2022 FT4 1.1 Vit D 20 TSH 0.84     09/08/2020  TSH 0.134  Ant-TPO Abs 9 IU/L  TSI < 1.0 TRAB 1.16 IU/L ( 0.00-1.75)   CT orbits 10/21/2020  Bilateral proptosis, with improvement since 2014. See above measurements. Previously noted enlargement of the extraocular muscles in 2014 has improved. Muscles are now somewhat atrophic and fatty replaced with overall decreased size since 2014.     MRI 09/06/2020 @ [facility]  T5 superior endplate compression fracture with 50% vertebral body height    Results:  Lumbar spine L1-L4 Femoral neck (FN) 33% distal radius  T-score   - 0.8  LFN: -2.6  -1.3  Change in BMD from previous DXA test (%) n/a n/a n/a   ASSESSMENT / PLAN / RECOMMENDATIONS:   Postablative Hypothyroidism:  -Patient is clinically euthyroid -No local neck symptoms -TSH remains normal  Medications :  Continue levothyroxine 125 mcg daily      2. Graves' Orbitopathy :   - Pt with hx of right eye sx secondary to Graves' disease in 2014.   - She was seen by [facility] ( Dr. Steele Sizer) in the past    3.  Osteoporosis with T5 compression fracture:  -She has been diagnosed with osteoporosis years ago, she was on Fosamax approximately for 2 years but she stopped it  in 2015 .  She has tried Prolia but that caused back pain - Forteo started 09/2021 though pt assistant , she has been out since April, refilled paper work again this year  - She continues with imperfect adherence to vitamin D, she had decreased calcium due to a serum calcium 10.7  (corrected 10.46, reference > 10.5)   Medication Caltrate 600 mg twice daily Vitamin D3 1000 IU daily Forteo 20 mcg daily   Follow-up in 6 months    Abby Raelyn Mora, MD  [facility] End[facility]  [facility] [address] [place], [place] [address] Phone: [phone] FAX: [phone]      CC: Hurshel Party, NP [address] [place] [address] Phone: [phone]  Fax: [phone]   Return to Endocrinology clinic as below: Future Appointments  Date Time Provider Department Center  08/16/2022  8:10 AM Andrew Blasius, Konrad Dolores, MD [id number] None  10/29/2022  2:10 PM Lynann Bologna, MD [facility]

## 2022-08-16 NOTE — Patient Instructions (Signed)

## 2022-08-16 NOTE — Telephone Encounter (Addendum)
Patient brought by [facility] Application along with her financial statement.  Application is in Dr. Harvel Ricks folder in front office.

## 2022-08-18 LAB — PTH, INTACT AND CALCIUM
Calcium: 9.5 mg/dL (ref 8.6–10.4)
PTH: 34 pg/mL (ref 16–77)

## 2022-08-18 LAB — CALCIUM, IONIZED: Calcium, Ion: 5.1 mg/dL (ref 4.7–5.5)

## 2022-08-21 NOTE — Telephone Encounter (Signed)
Forms have been faxed to  [facility]

## 2022-09-04 DIAGNOSIS — I480 Paroxysmal atrial fibrillation: Secondary | ICD-10-CM | POA: Diagnosis not present

## 2022-09-04 DIAGNOSIS — G4733 Obstructive sleep apnea (adult) (pediatric): Secondary | ICD-10-CM | POA: Diagnosis not present

## 2022-10-29 ENCOUNTER — Encounter: Payer: Self-pay | Admitting: Gastroenterology

## 2022-10-29 ENCOUNTER — Ambulatory Visit: Payer: Medicare Other | Admitting: Gastroenterology

## 2022-10-29 VITALS — Ht 65.0 in | Wt 286.4 lb

## 2022-10-29 DIAGNOSIS — Z86010 Personal history of colonic polyps: Secondary | ICD-10-CM

## 2022-10-29 DIAGNOSIS — K580 Irritable bowel syndrome with diarrhea: Secondary | ICD-10-CM

## 2022-10-29 NOTE — Progress Notes (Signed)
Chief Complaint: for colon  Referring Provider:  Hurshel Party, NP      ASSESSMENT AND PLAN;   #1.  IBS-D. Diarrhea could be d/t IBS/meds like metformin.   #2.  H/O colon polyps. Next colon due 10/2022  #3.  Comorbid conditions include A. fib on Eliquis, DM2, HLD, HTN, OSA on CPAP  Plan:  -Colon after cardiology clearence and holding eliquis x 24 hrs.    HPI:    Molly Patton is a 67 y.o. female  With H/O A. fib on Eliquis, DM2, HLD, HTN, OSA on CPAP, anxiety  Due for repeat colonoscopy  Continues to have intermittent diarrhea especially when she takes metformin.  No abdominal pain.  Had an episode of rotavirus gastroenteritis June 07, 2022 requiring IV fluids, admission to ICU.  Treated conservatively good good results.  She follows closely with Dr. Rogelio Seen.  With intermittent diarrhea  3/day (only when she takes metformin).  No blood.  Diarrhea more prominent ever since she has been on Metformin.  It does not occur when she takes Metformin at night.  She denies having any abdominal pain but does complain of abdominal bloating.   Denies having any upper GI symptoms including heartburn, nausea, vomiting, odynophagia or dysphagia.  No fever chills or night sweats.  Wt Readings from Last 3 Encounters:  10/29/22 286 lb 6 oz (129.9 kg)  08/16/22 278 lb (126.1 kg)  04/24/22 285 lb 8 oz (129.5 kg)    Past GI procedures: Colon 10/2019 ([facility]) -Colon polyps s/p polypectomy. Bx- TA, SSA -Mild sig div -Neg random Bx -Rpt 3 yrs.   -Colonoscopy 09/2014 ([facility]) significant colonic polyps s/p polypectomy, mild sigmoid diverticulosis.  Recommended to repeat in 3 years.  History of colonic polyps 2009  SH-widowed, has 2 boys and 1 girl. Past Medical History:  Diagnosis Date   Anxiety    Arrhythmia    Asthma    Atrial fibrillation (HCC)    Chronic sialoadenitis    Depression    Diabetes (HCC)    Elevated cholesterol    History of colon polyps     Hypertension    Obesity    Sleep apnea    On CPAP Machine    Thyroid disease    Graves disease s/p Iodine ablation now with hypothyroidism.    Past Surgical History:  Procedure Laterality Date   CARDIAC ELECTROPHYSIOLOGY MAPPING AND ABLATION     COLONOSCOPY  09/14/2014   Colonic polyps status post polypectomy. Mild sigmoid diverticulosis   EXCISION OF BREAST LESION  09/04/2007   Small fibroadenoma w/ coarse clustered microcalcification. Dr Logan Bores   eye decompression     ROTATOR CUFF REPAIR     SUBMANDIBULAR GLAND EXCISION Right 10/06/2001   Dr Shelva Majestic     Family History  Problem Relation Age of Onset   Prostate cancer Father    Diabetes Father    Colon cancer Cousin        1st cousin    Colon polyps Sister    Diabetes Sister    Irritable bowel syndrome Sister    Diabetes Brother    Congestive Heart Failure Mother    Esophageal cancer Neg Hx     Social History   Tobacco Use   Smoking status: Former   Smokeless tobacco: Never   Tobacco comments:    quit 30's years ago  Vaping Use   Vaping status: Never Used  Substance Use Topics   Alcohol use: Not Currently    Comment:  ocassionally    Drug use: Never    Current Outpatient Medications  Medication Sig Dispense Refill   apixaban (ELIQUIS) 5 MG TABS tablet Take 5 mg by mouth 2 (two) times daily.     busPIRone (BUSPAR) 10 MG tablet Take 1 tablet by mouth 2 (two) times daily.     CONTOUR NEXT TEST test strip 1 each daily.     diazepam (VALIUM) 10 MG tablet Take 10 mg by mouth as needed.      dicyclomine (BENTYL) 10 MG capsule Take 1 capsule (10 mg total) by mouth 2 (two) times daily. 90 capsule 3   flecainide (TAMBOCOR) 50 MG tablet Take 50 mg by mouth 2 (two) times daily.     levothyroxine (SYNTHROID) 125 MCG tablet Take 1 tablet (125 mcg total) by mouth daily. 90 tablet 3   lisinopril (PRINIVIL,ZESTRIL) 40 MG tablet Take 40 mg by mouth daily.     metFORMIN (GLUCOPHAGE) 500 MG tablet Take 1 tablet by mouth daily with  breakfast.     metoprolol tartrate (LOPRESSOR) 25 MG tablet Take 25 mg by mouth 2 (two) times daily.     mometasone-formoterol (DULERA) 200-5 MCG/ACT AERO Inhale 2 puffs into the lungs daily. Every Morning     montelukast (SINGULAIR) 10 MG tablet Take 10 mg by mouth daily.     omeprazole (PRILOSEC) 40 MG capsule Take 40 mg by mouth daily.     oxyCODONE-acetaminophen (PERCOCET/ROXICET) 5-325 MG tablet Take 1 tablet by mouth as needed.      pravastatin (PRAVACHOL) 20 MG tablet Take 20 mg by mouth daily.     Teriparatide, Recombinant, (FORTEO) 600 MCG/2.4ML SOPN Inject 20 mcg into the skin daily in the afternoon. 7.2 mL 3   Current Facility-Administered Medications  Medication Dose Route Frequency Provider Last Rate Last Admin   dexamethasone (DECADRON) injection 4 mg  4 mg Intra-articular Once Louann Sjogren, DPM        Allergies  Allergen Reactions   Aleve [Naproxen Sodium] Swelling   Naproxen Swelling    Review of Systems:  Constitutional: Denies fever, chills, diaphoresis, appetite change and has fatigue.  Psychiatric/Behavioral: Has anxiety or depression     Physical Exam:    Ht 5\' 5"  (1.651 m)   Wt 286 lb 6 oz (129.9 kg)   BMI 47.66 kg/m  Wt Readings from Last 3 Encounters:  10/29/22 286 lb 6 oz (129.9 kg)  08/16/22 278 lb (126.1 kg)  04/24/22 285 lb 8 oz (129.5 kg)   Constitutional:  Well-developed, in no acute distress. Psychiatric: Normal mood and affect. Behavior is normal. HEENT: Pupils normal.  Conjunctivae are normal. No scleral icterus. Cardiovascular: Normal rate, regular rhythm. No edema Pulmonary/chest: Effort normal and breath sounds normal. No wheezing, rales or rhonchi. Abdominal: Soft, nondistended. Nontender. Bowel sounds active throughout. There are no masses palpable. No hepatomegaly. LLQ mild tenderness. No rebound Neurological: Alert and oriented to person place and time. Skin: Skin is warm and dry. No rashes noted.  Data Reviewed: I have  personally reviewed following labs and imaging studies  CBC:    Latest Ref Rng & Units 04/24/2022    4:14 PM 04/07/2008    7:37 AM  CBC  WBC 4.0 - 10.5 K/uL 8.5  8.4   Hemoglobin 12.0 - 15.0 g/dL 16.1  09.6   Hematocrit 36.0 - 46.0 % 39.9  41.1   Platelets 150.0 - 400.0 K/uL 287.0  303     CMP:    Latest Ref Rng & Units  08/16/2022    8:48 AM 04/24/2022    4:14 PM 02/15/2022    9:23 AM  CMP  Glucose 70 - 99 mg/dL  132  440   BUN 6 - 23 mg/dL  24  20   Creatinine 1.02 - 1.20 mg/dL  7.25  3.66   Sodium 440 - 145 mEq/L  139  139   Potassium 3.5 - 5.1 mEq/L  4.1  4.7   Chloride 96 - 112 mEq/L  104  104   CO2 19 - 32 mEq/L  27  29   Calcium 8.6 - 10.4 mg/dL 9.5  34.7  42.5   Total Protein 6.0 - 8.3 g/dL  7.5    Total Bilirubin 0.2 - 1.2 mg/dL  0.3    Alkaline Phos 39 - 117 U/L  66    AST 0 - 37 U/L  13    ALT 0 - 35 U/L  14          Edman Circle, MD 10/29/2022, 1:59 PM  Cc: Hurshel Party, NP

## 2022-10-29 NOTE — Patient Instructions (Addendum)
_______________________________________________________  If your blood pressure at your visit was 140/90 or greater, please contact your primary care physician to follow up on this.  _______________________________________________________  If you are age 68 or older, your body mass index should be between 23-30. Your There is no height or weight on file to calculate BMI. If this is out of the aforementioned range listed, please consider follow up with your Primary Care Provider.  If you are age 6 or younger, your body mass index should be between 19-25. Your There is no height or weight on file to calculate BMI. If this is out of the aformentioned range listed, please consider follow up with your Primary Care Provider.   ________________________________________________________  The [facility] providers would like to encourage you to use [facility] to communicate with providers for non-urgent requests or questions.  Due to long hold times on the telephone, sending your provider a message by [facility] may be a faster and more efficient way to get a response.  Please allow 48 business hours for a response.  Please remember that this is for non-urgent requests.  _______________________________________________________  Bonita Quin will be contacted by our office prior to your procedure for directions on holding your Eliquis  If you do not hear from our office 1 week prior to your scheduled procedure, please call [phone] to discuss.   You have been scheduled for a colonoscopy. Please follow written instructions given to you at your visit today.   Please pick up your prep supplies at the pharmacy within the next 1-3 days.  If you use inhalers (even only as needed), please bring them with you on the day of your procedure.  DO NOT TAKE 7 DAYS PRIOR TO TEST- Trulicity (dulaglutide) Ozempic, Wegovy (semaglutide) Mounjaro (tirzepatide) Bydureon Bcise (exanatide extended release)  DO NOT TAKE 1 DAY PRIOR TO  YOUR TEST Rybelsus (semaglutide) Adlyxin (lixisenatide) Victoza (liraglutide) Byetta (exanatide) ___________________________________________________________________________   Thank you,  Dr. Lynann Bologna

## 2022-11-04 ENCOUNTER — Telehealth: Payer: Self-pay

## 2022-11-04 NOTE — Telephone Encounter (Signed)
Approval given for patient to hold Eliquis 1 day prior to her procedure by Kathryne Gin, NP [facility]   LVM for patient to call back as well and sent paper to be scan

## 2022-11-05 NOTE — Telephone Encounter (Signed)
Thank you :)

## 2022-11-05 NOTE — Telephone Encounter (Signed)
PT called back to confirm she received mychart message concerning Eliquis hold.

## 2022-11-05 NOTE — Telephone Encounter (Signed)
LVM and sent mychart message

## 2022-11-05 NOTE — Telephone Encounter (Signed)
PT understood what was explained on the hold and had no further questions or concerns.

## 2022-11-06 ENCOUNTER — Encounter: Payer: Self-pay | Admitting: Gastroenterology

## 2022-11-12 ENCOUNTER — Encounter: Payer: Self-pay | Admitting: Certified Registered Nurse Anesthetist

## 2022-11-13 ENCOUNTER — Ambulatory Visit (AMBULATORY_SURGERY_CENTER): Payer: Medicare Other | Admitting: Gastroenterology

## 2022-11-13 ENCOUNTER — Encounter: Payer: Self-pay | Admitting: Gastroenterology

## 2022-11-13 VITALS — BP 118/55 | HR 64 | Temp 98.7°F | Resp 17 | Ht 65.0 in | Wt 286.0 lb

## 2022-11-13 DIAGNOSIS — Z86010 Personal history of colonic polyps: Secondary | ICD-10-CM | POA: Diagnosis not present

## 2022-11-13 DIAGNOSIS — K635 Polyp of colon: Secondary | ICD-10-CM

## 2022-11-13 DIAGNOSIS — D120 Benign neoplasm of cecum: Secondary | ICD-10-CM | POA: Diagnosis not present

## 2022-11-13 DIAGNOSIS — D123 Benign neoplasm of transverse colon: Secondary | ICD-10-CM | POA: Diagnosis not present

## 2022-11-13 DIAGNOSIS — Z09 Encounter for follow-up examination after completed treatment for conditions other than malignant neoplasm: Secondary | ICD-10-CM

## 2022-11-13 DIAGNOSIS — D128 Benign neoplasm of rectum: Secondary | ICD-10-CM

## 2022-11-13 DIAGNOSIS — K580 Irritable bowel syndrome with diarrhea: Secondary | ICD-10-CM

## 2022-11-13 DIAGNOSIS — D125 Benign neoplasm of sigmoid colon: Secondary | ICD-10-CM

## 2022-11-13 MED ORDER — SODIUM CHLORIDE 0.9 % IV SOLN: 500.0000 mL | Freq: Once | INTRAVENOUS | Status: AC

## 2022-11-13 NOTE — Op Note (Signed)
[facility] Patient Name: Molly Patton Procedure Date: 11/13/2022 9:33 AM MRN: [id number] Endoscopist: Lynann Bologna , MD, [id number] Age: 67 Referring MD:  Date of Birth: March 01, 1956 Gender: Female Account #: [id number] Procedure:                Colonoscopy Indications:              High risk colon cancer surveillance: Personal                            history of colonic polyps Medicines:                Monitored Anesthesia Care Procedure:                Pre-Anesthesia Assessment:                           - Prior to the procedure, a History and Physical                            was performed, and patient medications and                            allergies were reviewed. The patient's tolerance of                            previous anesthesia was also reviewed. The risks                            and benefits of the procedure and the sedation                            options and risks were discussed with the patient.                            All questions were answered, and informed consent                            was obtained. Prior Anticoagulants: The patient has                            taken Eliquis (apixaban), last dose was 1 day prior                            to procedure. ASA Grade Assessment: III - A patient                            with severe systemic disease. After reviewing the                            risks and benefits, the patient was deemed in                            satisfactory condition to undergo the procedure.  After obtaining informed consent, the colonoscope                            was passed under direct vision. Throughout the                            procedure, the patient's blood pressure, pulse, and                            oxygen saturations were monitored continuously. The                            CF [id number] #[id number] was introduced through the anus                            and advanced to  the 2 cm into the ileum. The                            colonoscopy was performed without difficulty. The                            patient tolerated the procedure well. The quality                            of the bowel preparation was good. The terminal                            ileum, ileocecal valve, appendiceal orifice, and                            rectum were photographed. Scope In: 9:36:52 AM Scope Out: 9:53:30 AM Scope Withdrawal Time: 0 hours 14 minutes 24 seconds  Total Procedure Duration: 0 hours 16 minutes 38 seconds  Findings:                 Three sessile polyps were found in the cecum. The                            polyps were 2 to 6 mm in size. These polyps were                            removed with a cold snare. Resection and retrieval                            were complete.                           A 6 mm polyp was found in the mid transverse colon.                            The polyp was sessile. The polyp was removed with a  cold snare. Resection and retrieval were complete.                           Three sessile polyps were found in the rectum and                            distal sigmoid colon. The polyps were 2 to 4 mm in                            size. These polyps were removed with a cold snare.                            Resection and retrieval were complete.                           Many medium-mouthed diverticula were found in the                            sigmoid colon.                           Non-bleeding internal hemorrhoids were found during                            retroflexion. The hemorrhoids were small and Grade                            I (internal hemorrhoids that do not prolapse).                           The terminal ileum appeared normal.                           The exam was otherwise without abnormality on                            direct and retroflexion views. Complications:            No  immediate complications. Estimated Blood Loss:     Estimated blood loss: none. Impression:               - Three 2 to 6 mm polyps in the cecum, removed with                            a cold snare. Resected and retrieved.                           - One 6 mm polyp in the mid transverse colon,                            removed with a cold snare. Resected and retrieved.                           - Three 2 to 4 mm polyps in the rectum and  in the                            distal sigmoid colon, removed with a cold snare.                            Resected and retrieved.                           - Moderate sigmoid diverticulosis                           - Non-bleeding internal hemorrhoids.                           - The examined portion of the ileum was normal.                           - The examination was otherwise normal on direct                            and retroflexion views. Recommendation:           - Patient has a contact number available for                            emergencies. The signs and symptoms of potential                            delayed complications were discussed with the                            patient. Return to normal activities tomorrow.                            Written discharge instructions were provided to the                            patient.                           - Resume previous diet.                           - Continue present medications.                           - Resume Eliquis (apixaban) at prior dose 8/9                           - Await pathology results.                           - Repeat colonoscopy for surveillance based on                            pathology results.                           -  The findings and recommendations were discussed                            with the patient's family. Lynann Bologna, MD 11/13/2022 9:58:54 AM This report has been signed electronically.

## 2022-11-13 NOTE — Progress Notes (Signed)
Report given to PACU, vss 

## 2022-11-13 NOTE — Progress Notes (Signed)
Pt's states no medical or surgical changes since previsit or office visit. VS assessed by C.W 

## 2022-11-13 NOTE — Patient Instructions (Signed)
YOU HAD AN ENDOSCOPIC PROCEDURE TODAY AT THE [facility]:   Refer to the procedure report that was given to you for any specific questions about what was found during the examination.  If the procedure report does not answer your questions, please call your gastroenterologist to clarify.  If you requested that your care partner not be given the details of your procedure findings, then the procedure report has been included in a sealed envelope for you to review at your convenience later.  YOU SHOULD EXPECT: Some feelings of bloating in the abdomen. Passage of more gas than usual.  Walking can help get rid of the air that was put into your GI tract during the procedure and reduce the bloating. If you had a lower endoscopy (such as a colonoscopy or flexible sigmoidoscopy) you may notice spotting of blood in your stool or on the toilet paper. If you underwent a bowel prep for your procedure, you may not have a normal bowel movement for a few days.  Please Note:  You might notice some irritation and congestion in your nose or some drainage.  This is from the oxygen used during your procedure.  There is no need for concern and it should clear up in a day or so.  SYMPTOMS TO REPORT IMMEDIATELY:  Following lower endoscopy (colonoscopy or flexible sigmoidoscopy):  Excessive amounts of blood in the stool  Significant tenderness or worsening of abdominal pains  Swelling of the abdomen that is new, acute  Fever of 100F or higher  For urgent or emergent issues, a gastroenterologist can be reached at any hour by calling [phone]. Do not use MyChart messaging for urgent concerns.    DIET:  We do recommend a small meal at first, but then you may proceed to your regular diet.  Drink plenty of fluids but you should avoid alcoholic beverages for 24 hours.  MEDICATIONS: Continue present medications. Resume Eliquis (apixaban) at prior dose on Friday, 11/15/2022.  Please see handouts given to you  by your recovery nurse: Polyps, Diverticulosis, Hemorrhoids.  FOLLOW UP: Await pathology results. Repeat colonoscopy for surveillance based on pathology results.  Thank you for allowing [place] to provide for your healthcare needs today.  ACTIVITY:  You should plan to take it easy for the rest of today and you should NOT DRIVE or use heavy machinery until tomorrow (because of the sedation medicines used during the test).    FOLLOW UP: Our staff will call the number listed on your records the next business day following your procedure.  We will call around 7:15- 8:00 am to check on you and address any questions or concerns that you may have regarding the information given to you following your procedure. If we do not reach you, we will leave a message.     If any biopsies were taken you will be contacted by phone or by letter within the next 1-3 weeks.  Please call us at [phone] if you have not heard about the biopsies in 3 weeks.    SIGNATURES/CONFIDENTIALITY: You and/or your care partner have signed paperwork which will be entered into your electronic medical record.  These signatures attest to the fact that that the information above on your After Visit Summary has been reviewed and is understood.  Full responsibility of the confidentiality of this discharge information lies with you and/or your care-partner.

## 2022-11-13 NOTE — Progress Notes (Signed)
Chief Complaint: for colon  Referring Provider:  Hurshel Party, NP      ASSESSMENT AND PLAN;   #1.  IBS-D. Diarrhea could be d/t IBS/meds like metformin.   #2.  H/O colon polyps. Next colon due 10/2022  #3.  Comorbid conditions include A. fib on Eliquis, DM2, HLD, HTN, OSA on CPAP  Plan:  -Colon after cardiology clearence and holding eliquis x 24 hrs.    HPI:    Molly Patton is a 67 y.o. female  With H/O A. fib on Eliquis, DM2, HLD, HTN, OSA on CPAP, anxiety  Due for repeat colonoscopy  Continues to have intermittent diarrhea especially when she takes metformin.  No abdominal pain.  Had an episode of rotavirus gastroenteritis June 07, 2022 requiring IV fluids, admission to ICU.  Treated conservatively good good results.  She follows closely with Dr. Rogelio Seen.  With intermittent diarrhea  3/day (only when she takes metformin).  No blood.  Diarrhea more prominent ever since she has been on Metformin.  It does not occur when she takes Metformin at night.  She denies having any abdominal pain but does complain of abdominal bloating.   Denies having any upper GI symptoms including heartburn, nausea, vomiting, odynophagia or dysphagia.  No fever chills or night sweats.  Wt Readings from Last 3 Encounters:  11/13/22 286 lb (129.7 kg)  10/29/22 286 lb 6 oz (129.9 kg)  08/16/22 278 lb (126.1 kg)    Past GI procedures: Colon 10/2019 ([facility]) -Colon polyps s/p polypectomy. Bx- TA, SSA -Mild sig div -Neg random Bx -Rpt 3 yrs.   -Colonoscopy 09/2014 ([facility]) significant colonic polyps s/p polypectomy, mild sigmoid diverticulosis.  Recommended to repeat in 3 years.  History of colonic polyps 2009  SH-widowed, has 2 boys and 1 girl. Past Medical History:  Diagnosis Date   Anxiety    Arrhythmia    Asthma    Atrial fibrillation (HCC)    Chronic sialoadenitis    Depression    Diabetes (HCC)    Elevated cholesterol    History of colon polyps    Hypertension     Morbid (severe) obesity due to excess calories (HCC) 08/16/2022   bmi 46.26   Obesity    Sleep apnea    On CPAP Machine    Thyroid disease    Graves disease s/p Iodine ablation now with hypothyroidism.    Past Surgical History:  Procedure Laterality Date   CARDIAC ELECTROPHYSIOLOGY MAPPING AND ABLATION     COLONOSCOPY  09/14/2014   Colonic polyps status post polypectomy. Mild sigmoid diverticulosis   EXCISION OF BREAST LESION  09/04/2007   Small fibroadenoma w/ coarse clustered microcalcification. Dr Logan Bores   eye decompression     ROTATOR CUFF REPAIR     SUBMANDIBULAR GLAND EXCISION Right 10/06/2001   Dr Shelva Majestic     Family History  Problem Relation Age of Onset   Prostate cancer Father    Diabetes Father    Colon cancer Cousin        1st cousin    Colon polyps Sister    Diabetes Sister    Irritable bowel syndrome Sister    Diabetes Brother    Congestive Heart Failure Mother    Esophageal cancer Neg Hx     Social History   Tobacco Use   Smoking status: Former   Smokeless tobacco: Never   Tobacco comments:    quit 30's years ago  Vaping Use   Vaping status: Never Used  Substance Use Topics   Alcohol use: Not Currently    Comment: ocassionally    Drug use: Never    Current Outpatient Medications  Medication Sig Dispense Refill   busPIRone (BUSPAR) 10 MG tablet Take 1 tablet by mouth 2 (two) times daily.     CONTOUR NEXT TEST test strip 1 each daily.     flecainide (TAMBOCOR) 50 MG tablet Take 50 mg by mouth 2 (two) times daily.     levothyroxine (SYNTHROID) 125 MCG tablet Take 1 tablet (125 mcg total) by mouth daily. 90 tablet 3   lisinopril (PRINIVIL,ZESTRIL) 40 MG tablet Take 40 mg by mouth daily.     metFORMIN (GLUCOPHAGE) 500 MG tablet Take 1 tablet by mouth daily with breakfast.     metoprolol tartrate (LOPRESSOR) 25 MG tablet Take 25 mg by mouth 2 (two) times daily.     mometasone-formoterol (DULERA) 200-5 MCG/ACT AERO Inhale 2 puffs into the lungs daily.  Every Morning     montelukast (SINGULAIR) 10 MG tablet Take 10 mg by mouth daily.     omeprazole (PRILOSEC) 40 MG capsule Take 40 mg by mouth daily.     oxyCODONE-acetaminophen (PERCOCET/ROXICET) 5-325 MG tablet Take 1 tablet by mouth as needed.      pravastatin (PRAVACHOL) 20 MG tablet Take 20 mg by mouth daily.     Teriparatide, Recombinant, (FORTEO) 600 MCG/2.4ML SOPN Inject 20 mcg into the skin daily in the afternoon. 7.2 mL 3   apixaban (ELIQUIS) 5 MG TABS tablet Take 5 mg by mouth 2 (two) times daily.     diazepam (VALIUM) 10 MG tablet Take 10 mg by mouth as needed.      dicyclomine (BENTYL) 10 MG capsule Take 1 capsule (10 mg total) by mouth 2 (two) times daily. 90 capsule 3   Current Facility-Administered Medications  Medication Dose Route Frequency Provider Last Rate Last Admin   0.9 %  sodium chloride infusion  500 mL Intravenous Once Lynann Bologna, MD        Allergies  Allergen Reactions   Aleve [Naproxen Sodium] Swelling   Naproxen Swelling    Review of Systems:  Constitutional: Denies fever, chills, diaphoresis, appetite change and has fatigue.  Psychiatric/Behavioral: Has anxiety or depression     Physical Exam:    BP (!) 123/53   Pulse 65   Temp 98.7 F (37.1 C) (Skin)   Ht 5\' 5"  (1.651 m)   Wt 286 lb (129.7 kg)   SpO2 96%   BMI 47.59 kg/m  Wt Readings from Last 3 Encounters:  11/13/22 286 lb (129.7 kg)  10/29/22 286 lb 6 oz (129.9 kg)  08/16/22 278 lb (126.1 kg)   Constitutional:  Well-developed, in no acute distress. Psychiatric: Normal mood and affect. Behavior is normal. HEENT: Pupils normal.  Conjunctivae are normal. No scleral icterus. Cardiovascular: Normal rate, regular rhythm. No edema Pulmonary/chest: Effort normal and breath sounds normal. No wheezing, rales or rhonchi. Abdominal: Soft, nondistended. Nontender. Bowel sounds active throughout. There are no masses palpable. No hepatomegaly. LLQ mild tenderness. No rebound Neurological: Alert  and oriented to person place and time. Skin: Skin is warm and dry. No rashes noted.  Data Reviewed: I have personally reviewed following labs and imaging studies  CBC:    Latest Ref Rng & Units 04/24/2022    4:14 PM 04/07/2008    7:37 AM  CBC  WBC 4.0 - 10.5 K/uL 8.5  8.4   Hemoglobin 12.0 - 15.0 g/dL 78.2  13.5  Hematocrit 36.0 - 46.0 % 39.9  41.1   Platelets 150.0 - 400.0 K/uL 287.0  303     CMP:    Latest Ref Rng & Units 08/16/2022    8:48 AM 04/24/2022    4:14 PM 02/15/2022    9:23 AM  CMP  Glucose 70 - 99 mg/dL  951  884   BUN 6 - 23 mg/dL  24  20   Creatinine 1.66 - 1.20 mg/dL  0.63  0.16   Sodium 010 - 145 mEq/L  139  139   Potassium 3.5 - 5.1 mEq/L  4.1  4.7   Chloride 96 - 112 mEq/L  104  104   CO2 19 - 32 mEq/L  27  29   Calcium 8.6 - 10.4 mg/dL 9.5  93.2  35.5   Total Protein 6.0 - 8.3 g/dL  7.5    Total Bilirubin 0.2 - 1.2 mg/dL  0.3    Alkaline Phos 39 - 117 U/L  66    AST 0 - 37 U/L  13    ALT 0 - 35 U/L  14          Edman Circle, MD 11/13/2022, 9:32 AM  Cc: Hurshel Party, NP

## 2022-11-13 NOTE — Progress Notes (Signed)
Called to room to assist during endoscopic procedure.  Patient ID and intended procedure confirmed with present staff. Received instructions for my participation in the procedure from the performing physician.  

## 2022-11-14 ENCOUNTER — Telehealth: Payer: Self-pay

## 2022-11-14 NOTE — Telephone Encounter (Signed)
Post procedure follow up call, no answer 

## 2022-11-20 ENCOUNTER — Encounter: Payer: Self-pay | Admitting: Internal Medicine

## 2022-11-22 ENCOUNTER — Ambulatory Visit (INDEPENDENT_AMBULATORY_CARE_PROVIDER_SITE_OTHER): Payer: Medicare Other | Admitting: Internal Medicine

## 2022-11-22 ENCOUNTER — Encounter: Payer: Self-pay | Admitting: Internal Medicine

## 2022-11-22 VITALS — BP 120/80 | HR 68 | Ht 68.0 in | Wt 280.0 lb

## 2022-11-22 DIAGNOSIS — M8000XS Age-related osteoporosis with current pathological fracture, unspecified site, sequela: Secondary | ICD-10-CM | POA: Diagnosis not present

## 2022-11-22 DIAGNOSIS — H0520 Unspecified exophthalmos: Secondary | ICD-10-CM | POA: Diagnosis not present

## 2022-11-22 DIAGNOSIS — E559 Vitamin D deficiency, unspecified: Secondary | ICD-10-CM | POA: Diagnosis not present

## 2022-11-22 DIAGNOSIS — E890 Postprocedural hypothyroidism: Secondary | ICD-10-CM | POA: Diagnosis not present

## 2022-11-22 DIAGNOSIS — S22050S Wedge compression fracture of T5-T6 vertebra, sequela: Secondary | ICD-10-CM | POA: Diagnosis not present

## 2022-11-22 DIAGNOSIS — Z87310 Personal history of (healed) osteoporosis fracture: Secondary | ICD-10-CM

## 2022-11-22 DIAGNOSIS — L659 Nonscarring hair loss, unspecified: Secondary | ICD-10-CM | POA: Diagnosis not present

## 2022-11-22 LAB — T3, FREE: T3, Free: 3 pg/mL (ref 2.3–4.2)

## 2022-11-22 LAB — MAGNESIUM: Magnesium: 1.8 mg/dL (ref 1.5–2.5)

## 2022-11-22 LAB — BASIC METABOLIC PANEL
BUN: 22 mg/dL (ref 6–23)
CO2: 26 mEq/L (ref 19–32)
Calcium: 10.1 mg/dL (ref 8.4–10.5)
Chloride: 102 mEq/L (ref 96–112)
Creatinine, Ser: 0.79 mg/dL (ref 0.40–1.20)
GFR: 76.86 mL/min (ref >60.00)
Glucose, Bld: 120 mg/dL — ABNORMAL HIGH (ref 70–99)
Potassium: 4.7 mEq/L (ref 3.5–5.1)
Sodium: 136 mEq/L (ref 135–145)

## 2022-11-22 LAB — PHOSPHORUS: Phosphorus: 3.6 mg/dL (ref 2.3–4.6)

## 2022-11-22 LAB — TSH: TSH: 0.9 u[IU]/mL (ref 0.35–5.50)

## 2022-11-22 LAB — VITAMIN D 25 HYDROXY (VIT D DEFICIENCY, FRACTURES): VITD: 27.97 ng/mL — ABNORMAL LOW (ref 30.00–100.00)

## 2022-11-22 LAB — T4, FREE: Free T4: 1.05 ng/dL (ref 0.60–1.60)

## 2022-11-22 MED ORDER — TERIPARATIDE 600 MCG/2.4ML ~~LOC~~ SOPN: 20.0000 ug | PEN_INJECTOR | Freq: Every day | SUBCUTANEOUS | 3 refills | Status: DC

## 2022-11-22 NOTE — Progress Notes (Signed)
Name: Molly Patton  MRN/ DOB: [id number], 11/01/1955    Age/ Sex: 67 y.o., female     PCP: Hurshel Party, NP   Reason for Endocrinology Evaluation: Postablative hypothyroidism     Initial Endocrinology Clinic Visit: 07/03/2020    PATIENT IDENTIFIER: [name] is a 67 y.o., female with a past medical history of Asthma, A. Fib, HTN, OSA , T2DM and Hx of Graves' disease . She has followed with [facility] since 07/03/2020 for consultative assistance with management of her postablative hypothyroidism.      HISTORICAL SUMMARY:  She has been diagnosed with Grave's disease followed by RAI ablation in 2005. She has been on desiccated thyroid for years.   Has hx of right exophthalmos surgery in 2014    She was c/o hair loss and other non-specific symptoms, we switched her from NP thyroid to Levothyroxine in 06/2020  Mother and daughter with thyroid disease    OSTEOPOROSIS HISTORY : She has a hx of osteoporosis years ago, was on Fosamax for a couple yrs which was stopped in 2015.  Prolia caused back pain. She had a compression fracture of T5 09/2020  on MRI   24-hr urinary cortisol was normal at 11 ug 12/2020  No prior history of cancer or radiation exposure   Forteo was cost prohibitive and was provided with patient assistance which was approved in May 2023      SUBJECTIVE:    Today (11/22/2022):  Molly Patton is here for postablative hypothyroidism and osteoporosis .   She continues to follow-up with cardiology for A-fib, with prior cryoballoon ablation She has been following up with GI for IBS-D   She is c/o hair loss  She had recent falls Denies local neck swelling  She had an episode  of palpitations 2 days ago due to A. Fib Denies constipation or diarrhea    Levothryoxine 125 mcg daily  Caltrate 600 mg BID  Vitamin D 2000 iu daily  Forteo 20 mcg daily    HISTORY:  Past Medical History:  Past Medical History:  Diagnosis Date    Anxiety    Arrhythmia    Asthma    Atrial fibrillation (HCC)    Chronic sialoadenitis    Depression    Diabetes (HCC)    Elevated cholesterol    History of colon polyps    Hypertension    Morbid (severe) obesity due to excess calories (HCC) 08/16/2022   bmi 46.26   Obesity    Sleep apnea    On CPAP Machine    Thyroid disease    Graves disease s/p Iodine ablation now with hypothyroidism.   Past Surgical History:  Past Surgical History:  Procedure Laterality Date   CARDIAC ELECTROPHYSIOLOGY MAPPING AND ABLATION     COLONOSCOPY  09/14/2014   Colonic polyps status post polypectomy. Mild sigmoid diverticulosis   EXCISION OF BREAST LESION  09/04/2007   Small fibroadenoma w/ coarse clustered microcalcification. Dr Logan Bores   eye decompression     ROTATOR CUFF REPAIR     SUBMANDIBULAR GLAND EXCISION Right 10/06/2001   Dr Shelva Majestic    Social History:  reports that she has quit smoking. She has never used smokeless tobacco. She reports that she does not currently use alcohol. She reports that she does not use drugs. Family History:  Family History  Problem Relation Age of Onset   Prostate cancer Father    Diabetes Father    Colon cancer Cousin  1st cousin    Colon polyps Sister    Diabetes Sister    Irritable bowel syndrome Sister    Diabetes Brother    Congestive Heart Failure Mother    Esophageal cancer Neg Hx      HOME MEDICATIONS: Allergies as of 11/22/2022       Reactions   Aleve [naproxen Sodium] Swelling   Naproxen Swelling        Medication List        Accurate as of November 22, 2022 10:50 AM. If you have any questions, ask your nurse or doctor.          apixaban 5 MG Tabs tablet Commonly known as: ELIQUIS Take 5 mg by mouth 2 (two) times daily.   busPIRone 10 MG tablet Commonly known as: BUSPAR Take 1 tablet by mouth 2 (two) times daily.   Contour Next Test test strip Generic drug: glucose blood 1 each daily.   diazepam 10 MG tablet Commonly  known as: VALIUM Take 10 mg by mouth as needed.   dicyclomine 10 MG capsule Commonly known as: BENTYL Take 1 capsule (10 mg total) by mouth 2 (two) times daily.   flecainide 50 MG tablet Commonly known as: TAMBOCOR Take 50 mg by mouth 2 (two) times daily.   Forteo 600 MCG/2.4ML Sopn Generic drug: Teriparatide (Recombinant) Inject 20 mcg into the skin daily in the afternoon.   levothyroxine 125 MCG tablet Commonly known as: SYNTHROID Take 1 tablet (125 mcg total) by mouth daily.   lisinopril 40 MG tablet Commonly known as: ZESTRIL Take 40 mg by mouth daily.   metFORMIN 500 MG tablet Commonly known as: GLUCOPHAGE Take 1 tablet by mouth daily with breakfast.   metoprolol tartrate 25 MG tablet Commonly known as: LOPRESSOR Take 25 mg by mouth 2 (two) times daily.   mometasone-formoterol 200-5 MCG/ACT Aero Commonly known as: DULERA Inhale 2 puffs into the lungs daily. Every Morning   montelukast 10 MG tablet Commonly known as: SINGULAIR Take 10 mg by mouth daily.   omeprazole 40 MG capsule Commonly known as: PRILOSEC Take 40 mg by mouth daily.   oxyCODONE-acetaminophen 5-325 MG tablet Commonly known as: PERCOCET/ROXICET Take 1 tablet by mouth as needed.   pravastatin 20 MG tablet Commonly known as: PRAVACHOL Take 20 mg by mouth daily.          OBJECTIVE:   PHYSICAL EXAM: VS: BP 120/80 (BP Location: Left Arm, Patient Position: Sitting, Cuff Size: Large)   Pulse 68   Ht 5\' 8"  (1.727 m)   Wt 280 lb (127 kg)   SpO2 95%   BMI 42.57 kg/m    EXAM: General: Pt appears well and is in NAD  Lungs: Clear with good BS bilat with no rales, rhonchi, or wheezes  Heart: Auscultation: RRR.  Extremities:  BL LE: No pretibial edema   Mental Status: Judgment, insight: Intact Orientation: Oriented to time, place, and person Mood and affect: No depression, anxiety, or agitation     DATA REVIEWED:  Latest Reference Range & Units 11/22/22 11:17  TSH 0.35 - 5.50  uIU/mL 0.90  Triiodothyronine,Free,Serum 2.3 - 4.2 pg/mL 3.0  T4,Free(Direct) 0.60 - 1.60 ng/dL 1.61      Latest Reference Range & Units 11/22/22 11:17  Sodium 135 - 145 mEq/L 136  Potassium 3.5 - 5.1 mEq/L 4.7  Chloride 96 - 112 mEq/L 102  CO2 19 - 32 mEq/L 26  Glucose 70 - 99 mg/dL 096 (H)  BUN 6 - 23 mg/dL  22  Creatinine 0.40 - 1.20 mg/dL 2.95  Calcium 8.4 - 28.4 mg/dL 13.2  Phosphorus 2.3 - 4.6 mg/dL 3.6  Magnesium 1.5 - 2.5 mg/dL 1.8  GFR >44.01 mL/min 76.86  VITD 30.00 - 100.00 ng/mL 27.97 (L)  (H): Data is abnormally high (L): Data is abnormally low   09/08/2020 TSH 0.134  Ant-TPO Abs 9 IU/L  TSI < 1.0 TRAB 1.16 IU/L ( 0.00-1.75)     MRI 09/06/2020 @ [facility]  T5 superior endplate compression fracture with 50% vertebral body height    Results:  Lumbar spine L1-L4 Femoral neck (FN) 33% distal radius  T-score   - 0.8  LFN: -2.6  -1.3  Change in BMD from previous DXA test (%) n/a n/a n/a   ASSESSMENT / PLAN / RECOMMENDATIONS:   Postablative Hypothyroidism:  -Patient with hair loss and is concerned about her thyroid function specifically T3 -I did explain to the patient that T3 and T4 lose clinical value once the patient is on LT-for replacement, and clinical decisions are typically made on TSH -No local neck symptoms -TSH remains within normal range   Medications :  Continue levothyroxine 125 mcg daily      2. Graves' Orbitopathy :   - Pt with hx of right eye sx secondary to Graves' disease in 2014.   - She was seen by [facility]  in the past       3.  Osteoporosis with T5 compression fracture:  -She has been diagnosed with osteoporosis years ago, she was on Fosamax approximately for 2 years but she stopped it  in 2015 .  She has tried Prolia but that caused back pain - Forteo started 09/2021 though pt assistant, now she is trying to get it through her insurance, new prescription was sent today -Emphasized the importance of  optimizing vitamin D and calcium intake - BMP normal   Medication Calcium 1200 mg daily Continue Forteo 20 mcg daily   4.  Hair loss   -Patient on biotin -Discussed dermatology consultation   5. Vitamin D Insufficiency:   - Pt remain with low vitamin D despite being on 2000iu daily   - Will start ergocalciferol as below    Medication Ergocalciferol 50,000 weekly    Follow-up in 6 months    Abby Raelyn Mora, MD  [facility]  [facility] [address] [place], [place] [address] Phone: [phone] FAX: [phone]      CC: Hurshel Party, NP [address] [place] [address] Phone: [phone]  Fax: [phone]   Return to Endocrinology clinic as below: Future Appointments  Date Time Provider Department Center  02/19/2023  8:30 AM Claudius Mich, Konrad Dolores, MD [id number] None

## 2022-11-24 ENCOUNTER — Encounter: Payer: Self-pay | Admitting: Gastroenterology

## 2022-12-01 ENCOUNTER — Telehealth: Payer: Self-pay | Admitting: Internal Medicine

## 2022-12-01 MED ORDER — VITAMIN D (ERGOCALCIFEROL) 1.25 MG (50000 UNIT) PO CAPS: 50000.0000 [IU] | ORAL_CAPSULE | ORAL | 3 refills | Status: DC

## 2022-12-01 NOTE — Telephone Encounter (Signed)
Please let the pt know that her vitamin D remains low, please stop over the counter Vitamin D and start prescription Vitamin D which is ergocalciferol 50,000 international unit ONCE weekly from now on .   Thyroid, calcium and kidney function are normal    Thanks

## 2022-12-02 DIAGNOSIS — L648 Other androgenic alopecia: Secondary | ICD-10-CM | POA: Diagnosis not present

## 2022-12-02 DIAGNOSIS — L821 Other seborrheic keratosis: Secondary | ICD-10-CM | POA: Diagnosis not present

## 2022-12-02 DIAGNOSIS — L918 Other hypertrophic disorders of the skin: Secondary | ICD-10-CM | POA: Diagnosis not present

## 2022-12-03 DIAGNOSIS — R109 Unspecified abdominal pain: Secondary | ICD-10-CM | POA: Diagnosis not present

## 2022-12-03 DIAGNOSIS — I480 Paroxysmal atrial fibrillation: Secondary | ICD-10-CM | POA: Diagnosis not present

## 2022-12-03 DIAGNOSIS — R300 Dysuria: Secondary | ICD-10-CM | POA: Diagnosis not present

## 2022-12-03 DIAGNOSIS — G4733 Obstructive sleep apnea (adult) (pediatric): Secondary | ICD-10-CM | POA: Diagnosis not present

## 2022-12-03 DIAGNOSIS — I10 Essential (primary) hypertension: Secondary | ICD-10-CM | POA: Diagnosis not present

## 2022-12-03 DIAGNOSIS — Z7901 Long term (current) use of anticoagulants: Secondary | ICD-10-CM | POA: Diagnosis not present

## 2022-12-03 NOTE — Telephone Encounter (Signed)
I spoke to Molly Patton and she is aware of the new medication and instructions

## 2022-12-05 DIAGNOSIS — H532 Diplopia: Secondary | ICD-10-CM | POA: Diagnosis not present

## 2022-12-05 DIAGNOSIS — E0500 Thyrotoxicosis with diffuse goiter without thyrotoxic crisis or storm: Secondary | ICD-10-CM | POA: Diagnosis not present

## 2022-12-05 DIAGNOSIS — H05243 Constant exophthalmos, bilateral: Secondary | ICD-10-CM | POA: Diagnosis not present

## 2022-12-05 DIAGNOSIS — H04123 Dry eye syndrome of bilateral lacrimal glands: Secondary | ICD-10-CM | POA: Diagnosis not present

## 2022-12-05 DIAGNOSIS — H0279 Other degenerative disorders of eyelid and periocular area: Secondary | ICD-10-CM | POA: Diagnosis not present

## 2022-12-05 DIAGNOSIS — M170 Bilateral primary osteoarthritis of knee: Secondary | ICD-10-CM | POA: Diagnosis not present

## 2022-12-06 ENCOUNTER — Encounter: Payer: Self-pay | Admitting: Internal Medicine

## 2022-12-08 DIAGNOSIS — E119 Type 2 diabetes mellitus without complications: Secondary | ICD-10-CM | POA: Diagnosis not present

## 2022-12-11 ENCOUNTER — Ambulatory Visit (INDEPENDENT_AMBULATORY_CARE_PROVIDER_SITE_OTHER): Payer: Medicare Other | Admitting: Podiatry

## 2022-12-11 ENCOUNTER — Ambulatory Visit: Payer: Medicare Other | Admitting: Podiatry

## 2022-12-11 DIAGNOSIS — M19071 Primary osteoarthritis, right ankle and foot: Secondary | ICD-10-CM | POA: Diagnosis not present

## 2022-12-11 MED ORDER — TRIAMCINOLONE ACETONIDE 10 MG/ML IJ SUSP
10.0000 mg | Freq: Once | INTRAMUSCULAR | Status: AC
Start: 2022-12-11 — End: 2022-12-11
  Administered 2022-12-11: 10 mg via INTRA_ARTICULAR

## 2022-12-11 NOTE — Progress Notes (Signed)
   Chief Complaint  Patient presents with   Nail Problem    Right foot pain-requesting steroid injection.    HPI: 67 y.o. female presents today, after arriving at the wrong [facility] location for her appointment today and requesting to be seen anyway, for c/o pain on the top of the right midfoot.  She is leaving for a beach trip with friends tomorrow and wants her pain improved before her trip. She has previous xrays on file.  No injury since last seen. Pain before injection 6/10.  Past Medical History:  Diagnosis Date   Anxiety    Arrhythmia    Asthma    Atrial fibrillation (HCC)    Chronic sialoadenitis    Depression    Diabetes (HCC)    Elevated cholesterol    History of colon polyps    Hypertension    Morbid (severe) obesity due to excess calories (HCC) 08/16/2022   bmi 46.26   Obesity    Sleep apnea    On CPAP Machine    Thyroid disease    Graves disease s/p Iodine ablation now with hypothyroidism.    Past Surgical History:  Procedure Laterality Date   CARDIAC ELECTROPHYSIOLOGY MAPPING AND ABLATION     COLONOSCOPY  09/14/2014   Colonic polyps status post polypectomy. Mild sigmoid diverticulosis   EXCISION OF BREAST LESION  09/04/2007   Small fibroadenoma w/ coarse clustered microcalcification. Dr Logan Bores   eye decompression     ROTATOR CUFF REPAIR     SUBMANDIBULAR GLAND EXCISION Right 10/06/2001   Dr Shelva Majestic     Allergies  Allergen Reactions   Aleve [Naproxen Sodium] Swelling   Naproxen Swelling    Physical Exam: General: The patient is alert and oriented x3 in no acute distress.  Vascular: Palpable pedal pulses bilaterally. Capillary refill within normal limits.  No appreciable edema.  No erythema, ecchymosis or calor.  Neurological: Light touch sensation grossly intact bilateral feet.   Musculoskeletal Exam: Minimal POP to dorsal right 2nd met-cuneiform joint.  Discomfort with forced inversion/eversion of midfoot in same area.    Assessment/Plan of  Care: 1. Osteoarthritis of right ankle and foot     Meds ordered this encounter  Medications   triamcinolone acetonide (KENALOG) 10 MG/ML injection 10 mg   Discussed clinical findings with patient today.  With the patient's verbal consent, a corticosteroid injection was adminstered to the dorsal right midfoot at the 2nd met - intermediate cuneiform joint.  This consisted of a mixture of 1% lidocaine plain, 0.5% sensorcaine plain, and Kenalog-10 for a total of 1.25cc's administered.  Bandaid applied. Patient tolerated this well.   Pain after injection: 2/10.  F/u prn    Clerance Lav, DPM, FACFAS [facility]     [address] [place], [place] [address]                Office [phone]  Fax [phone]

## 2022-12-20 DIAGNOSIS — M1990 Unspecified osteoarthritis, unspecified site: Secondary | ICD-10-CM | POA: Diagnosis not present

## 2022-12-20 DIAGNOSIS — M810 Age-related osteoporosis without current pathological fracture: Secondary | ICD-10-CM | POA: Diagnosis not present

## 2022-12-20 DIAGNOSIS — E1165 Type 2 diabetes mellitus with hyperglycemia: Secondary | ICD-10-CM | POA: Diagnosis not present

## 2022-12-20 DIAGNOSIS — E785 Hyperlipidemia, unspecified: Secondary | ICD-10-CM | POA: Diagnosis not present

## 2022-12-20 DIAGNOSIS — F411 Generalized anxiety disorder: Secondary | ICD-10-CM | POA: Diagnosis not present

## 2022-12-25 DIAGNOSIS — I10 Essential (primary) hypertension: Secondary | ICD-10-CM | POA: Diagnosis not present

## 2022-12-25 DIAGNOSIS — I480 Paroxysmal atrial fibrillation: Secondary | ICD-10-CM | POA: Diagnosis not present

## 2022-12-26 ENCOUNTER — Ambulatory Visit: Payer: Medicare Other | Admitting: Internal Medicine

## 2022-12-26 DIAGNOSIS — I4719 Other supraventricular tachycardia: Secondary | ICD-10-CM | POA: Diagnosis not present

## 2022-12-26 DIAGNOSIS — G4733 Obstructive sleep apnea (adult) (pediatric): Secondary | ICD-10-CM | POA: Diagnosis not present

## 2022-12-26 DIAGNOSIS — I480 Paroxysmal atrial fibrillation: Secondary | ICD-10-CM | POA: Diagnosis not present

## 2023-01-07 DIAGNOSIS — E119 Type 2 diabetes mellitus without complications: Secondary | ICD-10-CM | POA: Diagnosis not present

## 2023-02-07 DIAGNOSIS — E119 Type 2 diabetes mellitus without complications: Secondary | ICD-10-CM | POA: Diagnosis not present

## 2023-02-17 ENCOUNTER — Encounter: Payer: Self-pay | Admitting: Gastroenterology

## 2023-02-17 DIAGNOSIS — H35371 Puckering of macula, right eye: Secondary | ICD-10-CM | POA: Diagnosis not present

## 2023-02-17 DIAGNOSIS — E119 Type 2 diabetes mellitus without complications: Secondary | ICD-10-CM | POA: Diagnosis not present

## 2023-02-19 ENCOUNTER — Ambulatory Visit: Payer: Medicare Other | Admitting: Internal Medicine

## 2023-03-07 ENCOUNTER — Other Ambulatory Visit: Payer: Self-pay | Admitting: Internal Medicine

## 2023-03-12 DIAGNOSIS — J301 Allergic rhinitis due to pollen: Secondary | ICD-10-CM | POA: Diagnosis not present

## 2023-03-12 DIAGNOSIS — J4540 Moderate persistent asthma, uncomplicated: Secondary | ICD-10-CM | POA: Diagnosis not present

## 2023-03-12 DIAGNOSIS — R400 Somnolence: Secondary | ICD-10-CM | POA: Diagnosis not present

## 2023-03-12 DIAGNOSIS — G4733 Obstructive sleep apnea (adult) (pediatric): Secondary | ICD-10-CM | POA: Diagnosis not present

## 2023-03-13 IMAGING — CT CT ORBITS W/O CM
1 series · 15 of 30 positions shown, 19 images · non-contrast
Comparison: CT orbit 10/30/2012

CLINICAL DATA: Exophthalmos. History of Graves disease. Prior
orbital decompression.

EXAM:
CT ORBITS WITHOUT CONTRAST
TECHNIQUE: Multidetector CT imaging of the orbits was performed using the
standard protocol without intravenous contrast. Multiplanar CT image
reconstructions were also generated.

[Series 4: orbits-soft · axial · 0.40mm/px · z∈[-162,-56]mm · 15 of 57 slices shown, 19 images]
[im 2/57  brain]
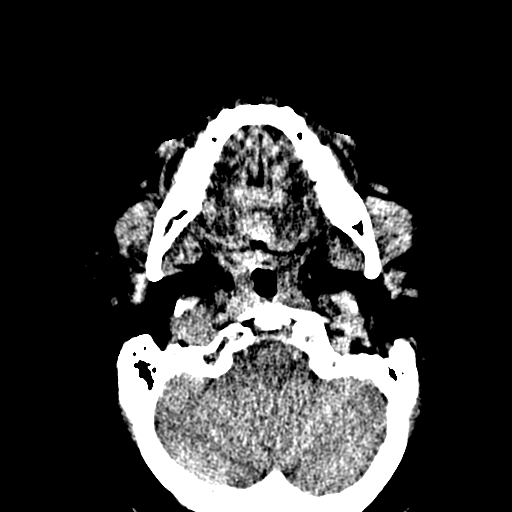
[im 2/57  bone]
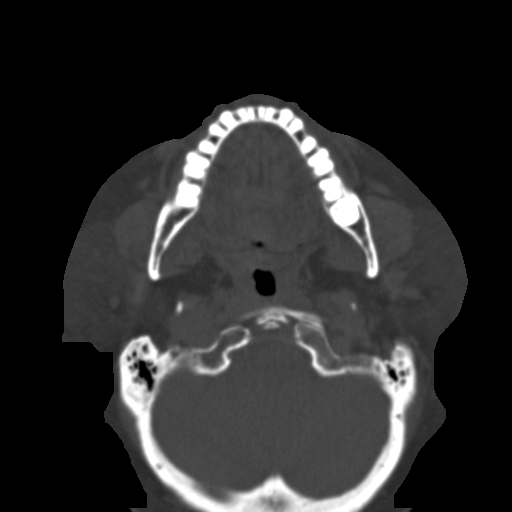
[im 6/57  bone]
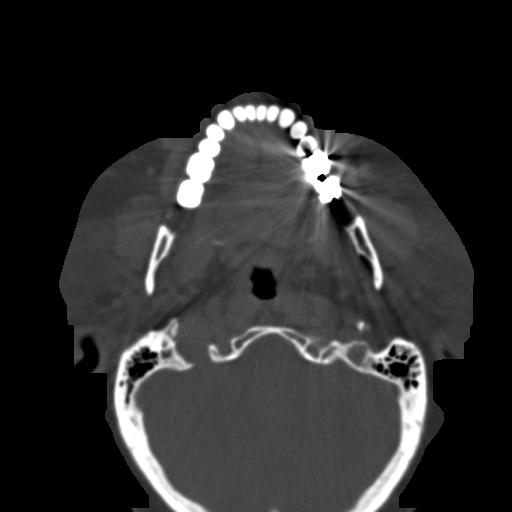
[im 10/57  bone]
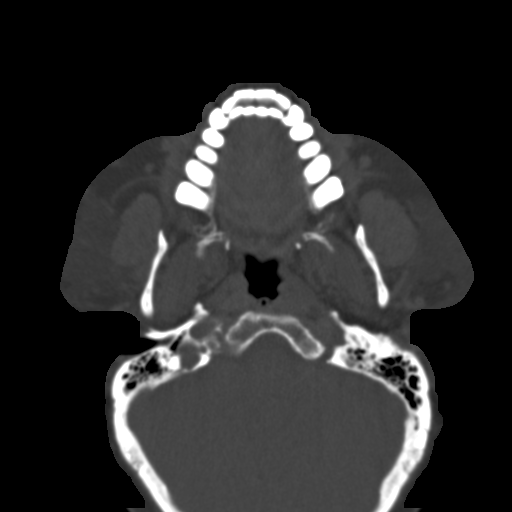
[im 14/57  bone]
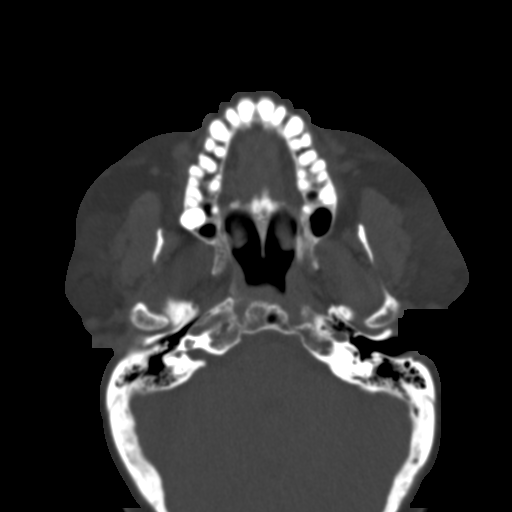
[im 18/57  brain]
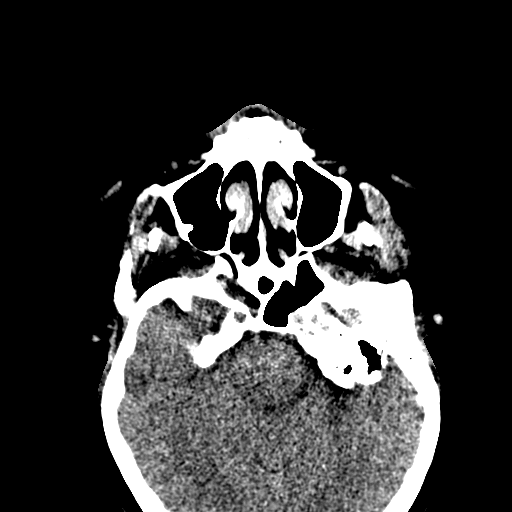
[im 18/57  bone]
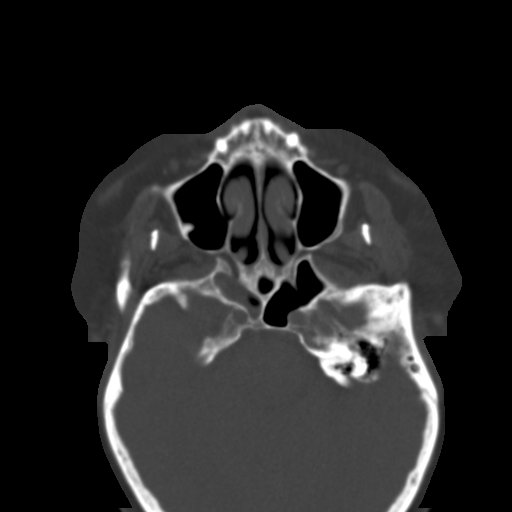
[im 22/57  bone]
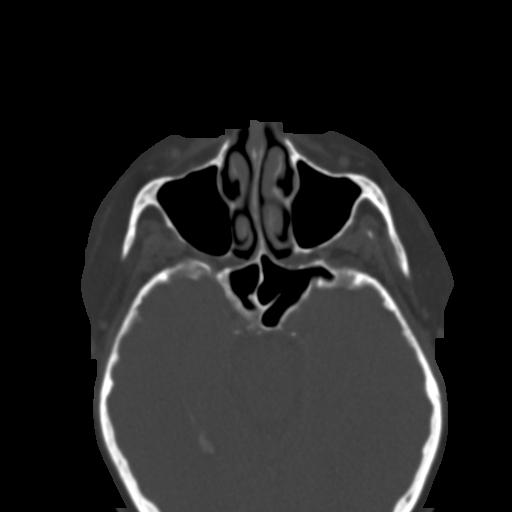
[im 26/57  bone]
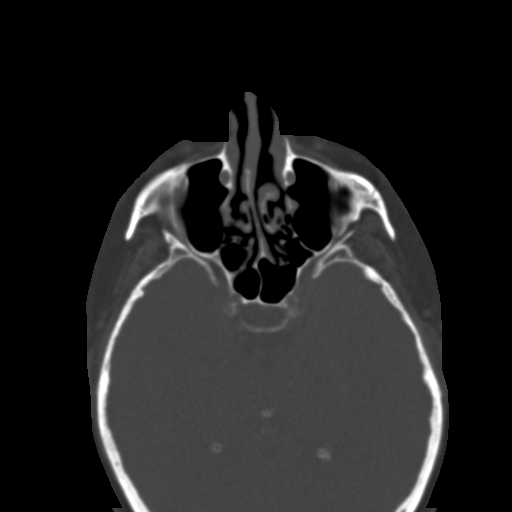
[im 29/57  bone]
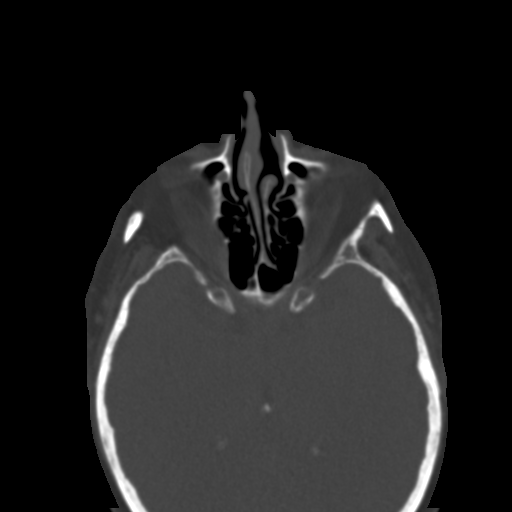
[im 31/57  brain]
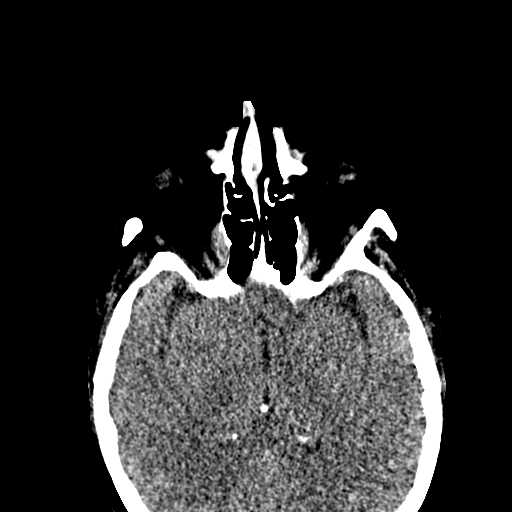
[im 31/57  bone]
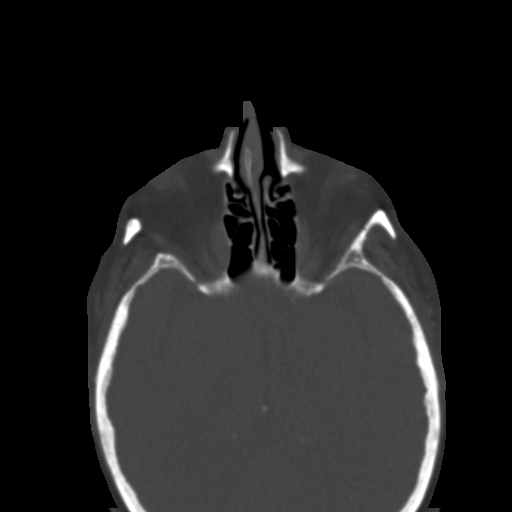
[im 35/57  bone]
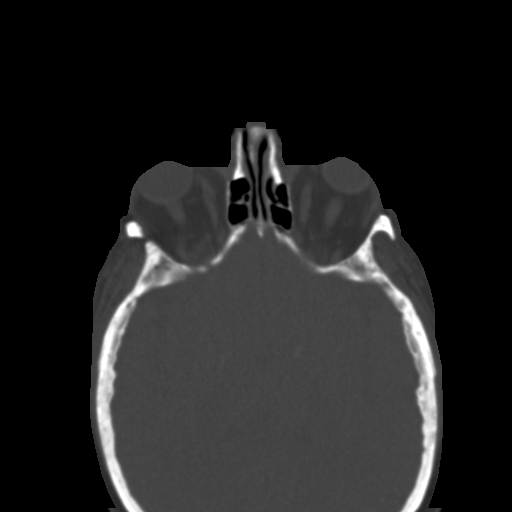
[im 39/57  bone]
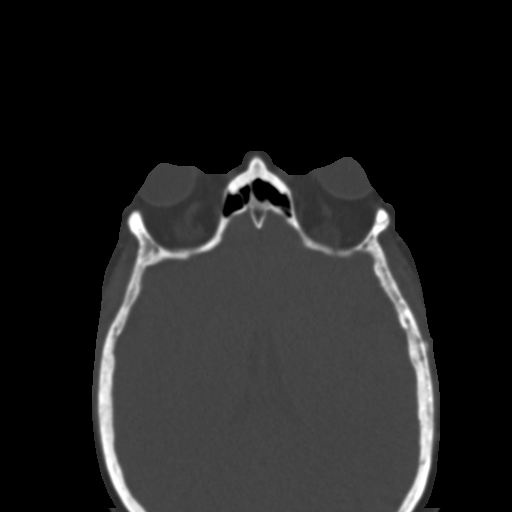
[im 43/57  bone]
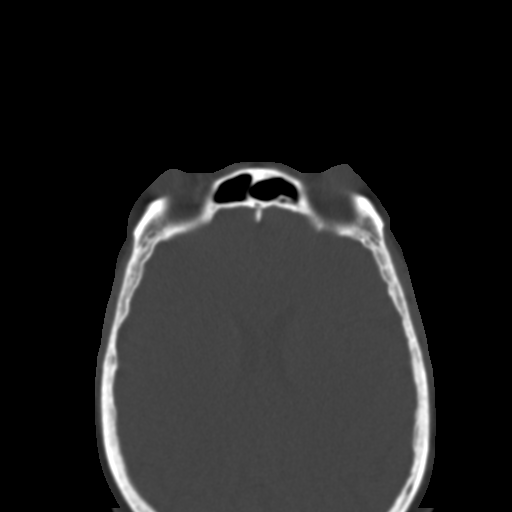
[im 47/57  brain]
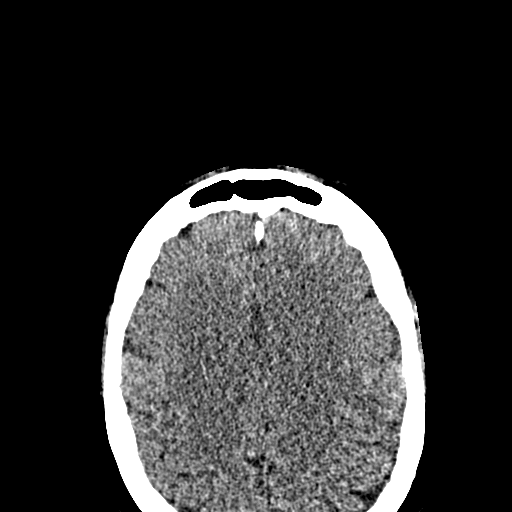
[im 47/57  bone]
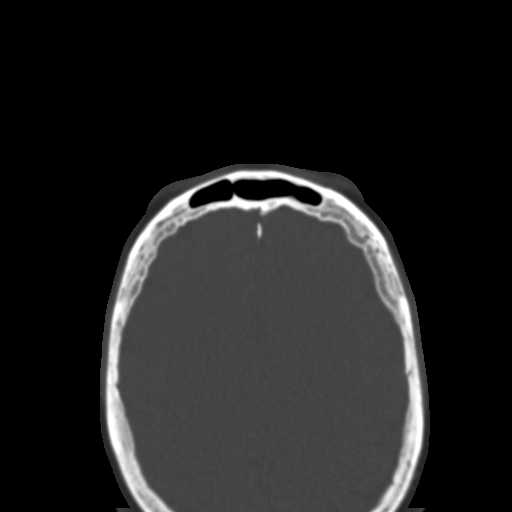
[im 51/57  bone]
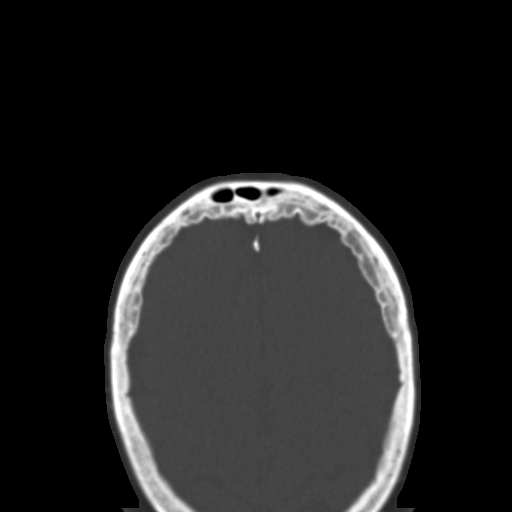
[im 55/57  bone]
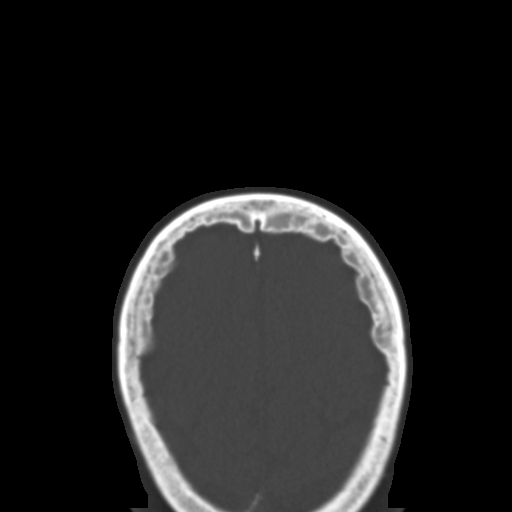

[15 of 30 positions shown; findings below may reference images not displayed]

FINDINGS: Orbits: Bilateral exophthalmos has improved since the prior CT 5953.
Measurements of the anterior zygomatic line to the anterior surface
of the orbit measures 26 mm bilaterally on today's study. Previously
this measured 30 mm bilaterally.

Enlargement of the extraocular muscles on the prior CT 5953 has
resolved. The muscles are now smaller and largely fatty replaced. No
orbital mass. Bilateral cataract extraction. No mass in the globe.

Visible paranasal sinuses: Mild mucosal edema in the maxillary sinus
bilaterally. Mucosal edema right sphenoid sinus. Remaining sinuses
clear. Mastoid sinus clear bilaterally.

Soft tissues: No soft tissue mass or edema.

Osseous: No acute abnormality. Moderate degenerative change TMJ
bilaterally.

Limited intracranial: Negative
IMPRESSION: Bilateral proptosis, with improvement since 5953. See above
measurements. Previously noted enlargement of the extraocular
muscles in 5953 has improved. Muscles are now somewhat atrophic and
fatty replaced with overall decreased size since [DATE].

## 2023-03-21 DIAGNOSIS — G4733 Obstructive sleep apnea (adult) (pediatric): Secondary | ICD-10-CM | POA: Diagnosis not present

## 2023-03-28 ENCOUNTER — Ambulatory Visit: Payer: Medicare Other | Admitting: Podiatry

## 2023-03-28 DIAGNOSIS — M19071 Primary osteoarthritis, right ankle and foot: Secondary | ICD-10-CM

## 2023-03-28 MED ORDER — TRIAMCINOLONE ACETONIDE 10 MG/ML IJ SUSP
10.0000 mg | Freq: Once | INTRAMUSCULAR | Status: AC
Start: 2023-03-28 — End: 2023-03-28
  Administered 2023-03-28: 10 mg

## 2023-03-28 NOTE — Progress Notes (Signed)
   Chief Complaint  Patient presents with   Foot Pain    PT STATES SHE NEEDS ANOTHER INJECTION. FBS TODAY 117 ELIQUIS   HPI: 67 y.o. female presents today, requesting a cortisone injection in her dorsal right 2nd met-cuneiform joint.  She had an injection in September before going on a beach trip and states that this provided significant relief.  She was pleased with the injection results last time.  She is requesting the same thing today.  Past Medical History:  Diagnosis Date   Anxiety    Arrhythmia    Asthma    Atrial fibrillation (HCC)    Chronic sialoadenitis    Depression    Diabetes (HCC)    Elevated cholesterol    History of colon polyps    Hypertension    Morbid (severe) obesity due to excess calories (HCC) 08/16/2022   bmi 46.26   Obesity    Sleep apnea    On CPAP Machine    Thyroid disease    Graves disease s/p Iodine ablation now with hypothyroidism.    Past Surgical History:  Procedure Laterality Date   CARDIAC ELECTROPHYSIOLOGY MAPPING AND ABLATION     COLONOSCOPY  09/14/2014   Colonic polyps status post polypectomy. Mild sigmoid diverticulosis   EXCISION OF BREAST LESION  09/04/2007   Small fibroadenoma w/ coarse clustered microcalcification. Dr Logan Bores   eye decompression     ROTATOR CUFF REPAIR     SUBMANDIBULAR GLAND EXCISION Right 10/06/2001   Dr Shelva Majestic     Allergies  Allergen Reactions   Aleve [Naproxen Sodium] Swelling   Naproxen Swelling    Physical Exam: General: The patient is alert and oriented x3 in no acute distress.  Vascular: Palpable pedal pulses bilaterally. Capillary refill within normal limits.  No appreciable edema.  No erythema, ecchymosis or calor.  Neurological: Light touch sensation grossly intact bilateral feet.   Musculoskeletal Exam: Minimal POP to dorsal right 2nd met-cuneiform joint.  Discomfort with forced inversion/eversion of midfoot in same area.    Assessment/Plan of Care: 1. Osteoarthritis of right ankle and foot       Meds ordered this encounter  Medications   triamcinolone acetonide (KENALOG) 10 MG/ML injection 10 mg   Discussed clinical findings with patient today.  With the patient's verbal consent, a corticosteroid injection was adminstered to the dorsal right midfoot at the 2nd met - intermediate cuneiform joint.  This consisted of a mixture of 1% lidocaine plain, 0.5% sensorcaine plain, and Kenalog-10 for a total of 1.25cc's administered.  Bandaid applied. Patient tolerated this well.   Pain after injection: 2/10.  F/u prn    Clerance Lav, DPM, FACFAS [facility]     [address] [place], [place] [address]                Office [phone]  Fax [phone]

## 2023-04-23 DIAGNOSIS — G4761 Periodic limb movement disorder: Secondary | ICD-10-CM | POA: Diagnosis not present

## 2023-04-23 DIAGNOSIS — G4733 Obstructive sleep apnea (adult) (pediatric): Secondary | ICD-10-CM | POA: Diagnosis not present

## 2023-04-23 DIAGNOSIS — J4540 Moderate persistent asthma, uncomplicated: Secondary | ICD-10-CM | POA: Diagnosis not present

## 2023-04-23 DIAGNOSIS — J301 Allergic rhinitis due to pollen: Secondary | ICD-10-CM | POA: Diagnosis not present

## 2023-04-25 DIAGNOSIS — J4540 Moderate persistent asthma, uncomplicated: Secondary | ICD-10-CM | POA: Diagnosis not present

## 2023-04-25 DIAGNOSIS — Z6841 Body Mass Index (BMI) 40.0 and over, adult: Secondary | ICD-10-CM | POA: Diagnosis not present

## 2023-04-25 DIAGNOSIS — M1990 Unspecified osteoarthritis, unspecified site: Secondary | ICD-10-CM | POA: Diagnosis not present

## 2023-04-25 DIAGNOSIS — E785 Hyperlipidemia, unspecified: Secondary | ICD-10-CM | POA: Diagnosis not present

## 2023-04-25 DIAGNOSIS — G4733 Obstructive sleep apnea (adult) (pediatric): Secondary | ICD-10-CM | POA: Diagnosis not present

## 2023-04-25 DIAGNOSIS — E1165 Type 2 diabetes mellitus with hyperglycemia: Secondary | ICD-10-CM | POA: Diagnosis not present

## 2023-04-25 DIAGNOSIS — M810 Age-related osteoporosis without current pathological fracture: Secondary | ICD-10-CM | POA: Diagnosis not present

## 2023-04-25 DIAGNOSIS — Z79891 Long term (current) use of opiate analgesic: Secondary | ICD-10-CM | POA: Diagnosis not present

## 2023-04-25 DIAGNOSIS — I480 Paroxysmal atrial fibrillation: Secondary | ICD-10-CM | POA: Diagnosis not present

## 2023-04-25 DIAGNOSIS — F411 Generalized anxiety disorder: Secondary | ICD-10-CM | POA: Diagnosis not present

## 2023-05-01 DIAGNOSIS — M79605 Pain in left leg: Secondary | ICD-10-CM | POA: Diagnosis not present

## 2023-05-07 DIAGNOSIS — G4733 Obstructive sleep apnea (adult) (pediatric): Secondary | ICD-10-CM | POA: Diagnosis not present

## 2023-05-23 DIAGNOSIS — G4733 Obstructive sleep apnea (adult) (pediatric): Secondary | ICD-10-CM | POA: Diagnosis not present

## 2023-05-23 DIAGNOSIS — J301 Allergic rhinitis due to pollen: Secondary | ICD-10-CM | POA: Diagnosis not present

## 2023-05-23 DIAGNOSIS — J4540 Moderate persistent asthma, uncomplicated: Secondary | ICD-10-CM | POA: Diagnosis not present

## 2023-05-23 DIAGNOSIS — G4761 Periodic limb movement disorder: Secondary | ICD-10-CM | POA: Diagnosis not present

## 2023-06-03 DIAGNOSIS — J4540 Moderate persistent asthma, uncomplicated: Secondary | ICD-10-CM | POA: Diagnosis not present

## 2023-06-03 DIAGNOSIS — J309 Allergic rhinitis, unspecified: Secondary | ICD-10-CM | POA: Diagnosis not present

## 2023-06-03 DIAGNOSIS — B9689 Other specified bacterial agents as the cause of diseases classified elsewhere: Secondary | ICD-10-CM | POA: Diagnosis not present

## 2023-06-03 DIAGNOSIS — J208 Acute bronchitis due to other specified organisms: Secondary | ICD-10-CM | POA: Diagnosis not present

## 2023-06-04 DIAGNOSIS — S82811D Torus fracture of upper end of right fibula, subsequent encounter for fracture with routine healing: Secondary | ICD-10-CM | POA: Diagnosis not present

## 2023-06-04 DIAGNOSIS — J4520 Mild intermittent asthma, uncomplicated: Secondary | ICD-10-CM | POA: Diagnosis not present

## 2023-06-04 DIAGNOSIS — I480 Paroxysmal atrial fibrillation: Secondary | ICD-10-CM | POA: Diagnosis not present

## 2023-06-04 DIAGNOSIS — G4733 Obstructive sleep apnea (adult) (pediatric): Secondary | ICD-10-CM | POA: Diagnosis not present

## 2023-06-24 DIAGNOSIS — G4733 Obstructive sleep apnea (adult) (pediatric): Secondary | ICD-10-CM | POA: Diagnosis not present

## 2023-06-26 DIAGNOSIS — Z6841 Body Mass Index (BMI) 40.0 and over, adult: Secondary | ICD-10-CM | POA: Diagnosis not present

## 2023-06-26 DIAGNOSIS — J019 Acute sinusitis, unspecified: Secondary | ICD-10-CM | POA: Diagnosis not present

## 2023-06-26 DIAGNOSIS — B9689 Other specified bacterial agents as the cause of diseases classified elsewhere: Secondary | ICD-10-CM | POA: Diagnosis not present

## 2023-07-25 DIAGNOSIS — G4733 Obstructive sleep apnea (adult) (pediatric): Secondary | ICD-10-CM | POA: Diagnosis not present

## 2023-07-29 DIAGNOSIS — G4733 Obstructive sleep apnea (adult) (pediatric): Secondary | ICD-10-CM | POA: Diagnosis not present

## 2023-08-01 DIAGNOSIS — J301 Allergic rhinitis due to pollen: Secondary | ICD-10-CM | POA: Diagnosis not present

## 2023-08-01 DIAGNOSIS — G4733 Obstructive sleep apnea (adult) (pediatric): Secondary | ICD-10-CM | POA: Diagnosis not present

## 2023-08-01 DIAGNOSIS — G4761 Periodic limb movement disorder: Secondary | ICD-10-CM | POA: Diagnosis not present

## 2023-08-01 DIAGNOSIS — J4540 Moderate persistent asthma, uncomplicated: Secondary | ICD-10-CM | POA: Diagnosis not present

## 2023-08-08 ENCOUNTER — Ambulatory Visit (INDEPENDENT_AMBULATORY_CARE_PROVIDER_SITE_OTHER): Admitting: Podiatry

## 2023-08-08 DIAGNOSIS — M19071 Primary osteoarthritis, right ankle and foot: Secondary | ICD-10-CM | POA: Diagnosis not present

## 2023-08-08 NOTE — Progress Notes (Unsigned)
      Chief Complaint  Patient presents with   Injections    Here today for inj to the right mid foot, it is clean and prepped with provodine swab. Last injection lasted until this week. It is not as bad as it can get because she caught it early. Last A1c was 6.0 in Jan. She take elliquis   HPI: 68 y.o. female presents today requesting an injection to the right dorsal midfoot, similar to what she had before.  She states that the last injection helped her well and she was able to continue walking activities without significant discomfort.  She stated that she just started noticing the symptoms returning and wanted to get ahead of at this time.  Past Medical History:  Diagnosis Date   Anxiety    Arrhythmia    Asthma    Atrial fibrillation (HCC)    Chronic sialoadenitis    Depression    Diabetes (HCC)    Elevated cholesterol    History of colon polyps    Hypertension    Morbid (severe) obesity due to excess calories (HCC) 08/16/2022   bmi 46.26   Obesity    Sleep apnea    On CPAP Machine    Thyroid  disease    Graves disease s/p Iodine ablation now with hypothyroidism.    Past Surgical History:  Procedure Laterality Date   CARDIAC ELECTROPHYSIOLOGY MAPPING AND ABLATION     COLONOSCOPY  09/14/2014   Colonic polyps status post polypectomy. Mild sigmoid diverticulosis   EXCISION OF BREAST LESION  09/04/2007   Small fibroadenoma w/ coarse clustered microcalcification. Dr Luster Salters   eye decompression     ROTATOR CUFF REPAIR     SUBMANDIBULAR GLAND EXCISION Right 10/06/2001   Dr Stephenie Einstein     Allergies  Allergen Reactions   Aleve [Naproxen Sodium] Swelling   Naproxen Swelling    Review of Systems  Musculoskeletal:  Positive for joint pain.      Physical Exam: Palpable pedal pulses noted.  +1 pitting edema to the lower legs and ankles bilateral.  Pain on palpation to the second metatarsal-intermediate cuneiform joint on the right foot.  No rubor or calor is noted.  Negative Tinel's  sign of the deep peroneal nerve.  Epicritic sensation is intact  Assessment/Plan of Care: 1. Osteoarthritis of right ankle and foot     Discussed clinical findings with patient today.  With the patient's verbal consent, a corticosteroid injection was adminstered to the dorsal right second-intermediate cuneiform joint.  This consisted of a mixture of 1% lidocaine plain, 0.5% sensorcaine plain, and Kenalog -10 for a total of 1.25cc's administered.  Bandaid applied. Patient tolerated this well.     Follow-up as needed   Joe Murders, DPM, FACFAS [facility]     [address] [place], [place] [address]                Office [phone]  Fax [phone] to right dorsal second met-intermediate cuneiform joint today.  She has a little bit more weight loss to go before she is permitted to have her knee replacement surgery performed.

## 2023-08-24 DIAGNOSIS — G4733 Obstructive sleep apnea (adult) (pediatric): Secondary | ICD-10-CM | POA: Diagnosis not present

## 2023-08-26 DIAGNOSIS — E1165 Type 2 diabetes mellitus with hyperglycemia: Secondary | ICD-10-CM | POA: Diagnosis not present

## 2023-08-26 DIAGNOSIS — M810 Age-related osteoporosis without current pathological fracture: Secondary | ICD-10-CM | POA: Diagnosis not present

## 2023-08-26 DIAGNOSIS — J4540 Moderate persistent asthma, uncomplicated: Secondary | ICD-10-CM | POA: Diagnosis not present

## 2023-08-26 DIAGNOSIS — G4733 Obstructive sleep apnea (adult) (pediatric): Secondary | ICD-10-CM | POA: Diagnosis not present

## 2023-08-26 DIAGNOSIS — F411 Generalized anxiety disorder: Secondary | ICD-10-CM | POA: Diagnosis not present

## 2023-08-26 DIAGNOSIS — Z6841 Body Mass Index (BMI) 40.0 and over, adult: Secondary | ICD-10-CM | POA: Diagnosis not present

## 2023-08-26 DIAGNOSIS — E785 Hyperlipidemia, unspecified: Secondary | ICD-10-CM | POA: Diagnosis not present

## 2023-08-26 DIAGNOSIS — M1990 Unspecified osteoarthritis, unspecified site: Secondary | ICD-10-CM | POA: Diagnosis not present

## 2023-08-28 DIAGNOSIS — G4733 Obstructive sleep apnea (adult) (pediatric): Secondary | ICD-10-CM | POA: Diagnosis not present

## 2023-09-05 ENCOUNTER — Other Ambulatory Visit: Payer: Self-pay | Admitting: Internal Medicine

## 2023-09-15 DIAGNOSIS — Z0000 Encounter for general adult medical examination without abnormal findings: Secondary | ICD-10-CM | POA: Diagnosis not present

## 2023-09-15 DIAGNOSIS — Z1331 Encounter for screening for depression: Secondary | ICD-10-CM | POA: Diagnosis not present

## 2023-09-15 DIAGNOSIS — Z9181 History of falling: Secondary | ICD-10-CM | POA: Diagnosis not present

## 2023-10-16 DIAGNOSIS — M170 Bilateral primary osteoarthritis of knee: Secondary | ICD-10-CM | POA: Diagnosis not present

## 2023-10-20 DIAGNOSIS — G4733 Obstructive sleep apnea (adult) (pediatric): Secondary | ICD-10-CM | POA: Diagnosis not present

## 2023-11-03 ENCOUNTER — Other Ambulatory Visit: Payer: Self-pay | Admitting: Internal Medicine

## 2023-11-27 ENCOUNTER — Other Ambulatory Visit: Payer: Self-pay | Admitting: Internal Medicine

## 2023-11-27 NOTE — Telephone Encounter (Signed)
 Refill request complete

## 2023-12-01 ENCOUNTER — Ambulatory Visit (INDEPENDENT_AMBULATORY_CARE_PROVIDER_SITE_OTHER): Payer: Medicare Other | Admitting: Internal Medicine

## 2023-12-01 ENCOUNTER — Encounter: Payer: Self-pay | Admitting: Internal Medicine

## 2023-12-01 VITALS — BP 120/76 | HR 77 | Ht 65.0 in | Wt 231.0 lb

## 2023-12-01 DIAGNOSIS — E890 Postprocedural hypothyroidism: Secondary | ICD-10-CM | POA: Diagnosis not present

## 2023-12-01 DIAGNOSIS — M8000XS Age-related osteoporosis with current pathological fracture, unspecified site, sequela: Secondary | ICD-10-CM | POA: Diagnosis not present

## 2023-12-01 DIAGNOSIS — M810 Age-related osteoporosis without current pathological fracture: Secondary | ICD-10-CM | POA: Diagnosis not present

## 2023-12-01 DIAGNOSIS — E559 Vitamin D deficiency, unspecified: Secondary | ICD-10-CM

## 2023-12-01 DIAGNOSIS — Z8639 Personal history of other endocrine, nutritional and metabolic disease: Secondary | ICD-10-CM | POA: Diagnosis not present

## 2023-12-01 LAB — BASIC METABOLIC PANEL WITH GFR
BUN: 20 mg/dL (ref 7–25)
CO2: 26 mmol/L (ref 20–32)
Calcium: 9.4 mg/dL (ref 8.6–10.4)
Chloride: 104 mmol/L (ref 98–110)
Creat: 0.93 mg/dL (ref 0.50–1.05)
Glucose, Bld: 107 mg/dL — ABNORMAL HIGH (ref 65–99)
Potassium: 4.6 mmol/L (ref 3.5–5.3)
Sodium: 140 mmol/L (ref 135–146)
eGFR: 67 mL/min/1.73m2 (ref >=60)

## 2023-12-01 LAB — T4, FREE: Free T4: 1.5 ng/dL (ref 0.8–1.8)

## 2023-12-01 LAB — VITAMIN D 25 HYDROXY (VIT D DEFICIENCY, FRACTURES): Vit D, 25-Hydroxy: 93 ng/mL (ref 30–100)

## 2023-12-01 LAB — TSH: TSH: 0.14 m[IU]/L — ABNORMAL LOW (ref 0.40–4.50)

## 2023-12-01 NOTE — Progress Notes (Unsigned)
 Name: Molly Patton  MRN/ DOB: [id number], [birth year]    Age/ Sex: 68 y.o., female     PCP: Erick Greig LABOR, NP   Reason for Endocrinology Evaluation: Postablative hypothyroidism     Initial Endocrinology Clinic Visit: 07/03/2020    PATIENT IDENTIFIER: Molly Patton is a 68 y.o., female with a past medical history of Asthma, A. Fib, HTN, OSA , T2DM and Hx of Graves' disease . She has followed with [facility] since 07/03/2020 for consultative assistance with management of her postablative hypothyroidism.      HISTORICAL SUMMARY:  She has been diagnosed with Grave's disease followed by RAI ablation in 2005. She has been on desiccated thyroid  for years.   Has hx of right exophthalmos surgery in 2014    She was c/o hair loss and other non-specific symptoms, we switched her from NP thyroid  to Levothyroxine  in 06/2020  Mother and daughter with thyroid  disease    OSTEOPOROSIS HISTORY : She has a hx of osteoporosis years ago, was on Fosamax for a couple yrs which was stopped in 2015.  Prolia caused back pain. She had a compression fracture of T5 09/2020  on MRI   24-hr urinary cortisol was normal at 11 ug 12/2020  No prior history of cancer or radiation exposure   Forteo  was cost prohibitive and was provided with patient assistance which was approved in May 2023    Completed Forteo  April, 2025  SUBJECTIVE:    Today (12/01/2023):  [name] is here for postablative hypothyroidism and osteoporosis .  Patient follows with sports medicine through [facility] for primary osteoarthritis of both knees She was evaluated by podiatry 08/08/2023 She continues to follow-up with cardiology for A-fib, with prior cryoballoon ablation Patient also follows with pulmonary for asthma  Last Forteo  injection Apri, 2025 She had a fall in January, 2025 Pending right knee replacement NO strengthening exercise  Denies local neck swelling  Denies diarrhea or constipation  No  recent palpitations  She is on PPI, which controls symptoms    Levothryoxine 125 mcg daily  Caltrate 600 mg BID  Ergocalciferol  50,000 units weekly Forteo  20 mcg daily    HISTORY:  Past Medical History:  Past Medical History:  Diagnosis Date   Anxiety    Arrhythmia    Asthma    Atrial fibrillation (HCC)    Chronic sialoadenitis    Depression    Diabetes (HCC)    Elevated cholesterol    History of colon polyps    Hypertension    Morbid (severe) obesity due to excess calories (HCC) 08/16/2022   bmi 46.26   Obesity    Sleep apnea    On CPAP Machine    Thyroid  disease    Graves disease s/p Iodine ablation now with hypothyroidism.   Past Surgical History:  Past Surgical History:  Procedure Laterality Date   CARDIAC ELECTROPHYSIOLOGY MAPPING AND ABLATION     COLONOSCOPY  09/14/2014   Colonic polyps status post polypectomy. Mild sigmoid diverticulosis   EXCISION OF BREAST LESION  09/04/2007   Small fibroadenoma w/ coarse clustered microcalcification. Dr Janit   eye decompression     ROTATOR CUFF REPAIR     SUBMANDIBULAR GLAND EXCISION Right 10/06/2001   Dr devora    Social History:  reports that she has quit smoking. She has never used smokeless tobacco. She reports that she does not currently use alcohol. She reports that she does not use drugs. Family History:  Family History  Problem Relation Age of Onset   Prostate cancer Father    Diabetes Father    Colon cancer Cousin        1st cousin    Colon polyps Sister    Diabetes Sister    Irritable bowel syndrome Sister    Diabetes Brother    Congestive Heart Failure Mother    Esophageal cancer Neg Hx      HOME MEDICATIONS: Allergies as of 12/01/2023       Reactions   Aleve [naproxen Sodium] Swelling   Naproxen Swelling        Medication List        Accurate as of December 01, 2023  9:15 AM. If you have any questions, ask your nurse or doctor.          apixaban 5 MG Tabs tablet Commonly known as:  ELIQUIS Take 5 mg by mouth 2 (two) times daily.   busPIRone 10 MG tablet Commonly known as: BUSPAR Take 1 tablet by mouth 2 (two) times daily.   Contour Next Test test strip Generic drug: glucose blood 1 each daily.   diazepam 10 MG tablet Commonly known as: VALIUM Take 10 mg by mouth as needed.   dicyclomine  10 MG capsule Commonly known as: BENTYL  Take 1 capsule (10 mg total) by mouth 2 (two) times daily.   flecainide 50 MG tablet Commonly known as: TAMBOCOR Take 50 mg by mouth 2 (two) times daily.   levothyroxine  125 MCG tablet Commonly known as: SYNTHROID  Take 1 tablet by mouth once daily   lisinopril 40 MG tablet Commonly known as: ZESTRIL Take 40 mg by mouth daily.   metoprolol tartrate 25 MG tablet Commonly known as: LOPRESSOR Take 25 mg by mouth 2 (two) times daily.   mometasone-formoterol 200-5 MCG/ACT Aero Commonly known as: DULERA Inhale 2 puffs into the lungs daily. Every Morning   montelukast 10 MG tablet Commonly known as: SINGULAIR Take 10 mg by mouth daily.   Mounjaro 7.5 MG/0.5ML Pen Generic drug: tirzepatide SMARTSIG:7.5 Milligram(s) SUB-Q Once a Week   omeprazole 40 MG capsule Commonly known as: PRILOSEC Take 40 mg by mouth daily.   oxyCODONE-acetaminophen 5-325 MG tablet Commonly known as: PERCOCET/ROXICET Take 1 tablet by mouth as needed.   pravastatin 20 MG tablet Commonly known as: PRAVACHOL Take 20 mg by mouth daily.   Teriparatide  600 MCG/2.4ML Sopn Commonly known as: Forteo  Inject 20 mcg into the skin daily in the afternoon.   Vitamin D  (Ergocalciferol ) 1.25 MG (50000 UNIT) Caps capsule Commonly known as: DRISDOL  Take 1 capsule by mouth once a week          OBJECTIVE:   PHYSICAL EXAM: VS: BP 120/76 (BP Location: Left Arm, Patient Position: Sitting, Cuff Size: Normal)   Pulse 77   Ht 5' 8 (1.727 m)   Wt 231 lb (104.8 kg)   SpO2 95%   BMI 35.12 kg/m    EXAM: General: Pt appears well and is in NAD  Lungs:  Clear with good BS bilat with no rales, rhonchi, or wheezes  Heart: Auscultation: RRR.  Extremities:  BL LE: No pretibial edema   Mental Status: Judgment, insight: Intact Orientation: Oriented to time, place, and person Mood and affect: No depression, anxiety, or agitation     DATA REVIEWED:  Latest Reference Range & Units 11/22/22 11:17  TSH 0.35 - 5.50 uIU/mL 0.90  Triiodothyronine,Free,Serum 2.3 - 4.2 pg/mL 3.0  T4,Free(Direct) 0.60 - 1.60 ng/dL 8.94      Latest Reference  Range & Units 11/22/22 11:17  Sodium 135 - 145 mEq/L 136  Potassium 3.5 - 5.1 mEq/L 4.7  Chloride 96 - 112 mEq/L 102  CO2 19 - 32 mEq/L 26  Glucose 70 - 99 mg/dL 879 (H)  BUN 6 - 23 mg/dL 22  Creatinine 9.59 - 8.79 mg/dL 9.20  Calcium 8.4 - 89.4 mg/dL 89.8  Phosphorus 2.3 - 4.6 mg/dL 3.6  Magnesium 1.5 - 2.5 mg/dL 1.8  GFR >39.99 mL/min 76.86  VITD 30.00 - 100.00 ng/mL 27.97 (L)    09/08/2020 TSH 0.134  Ant-TPO Abs 9 IU/L  TSI < 1.0 TRAB 1.16 IU/L ( 0.00-1.75)     MRI 09/06/2020 @ [facility]  T5 superior endplate compression fracture with 50% vertebral body height    Results:  Lumbar spine L1-L4 Femoral neck (FN) 33% distal radius  T-score   - 0.8  LFN: -2.6  -1.3  Change in BMD from previous DXA test (%) n/a n/a n/a   ASSESSMENT / PLAN / RECOMMENDATIONS:   Postablative Hypothyroidism:  -Patient with hair loss and is concerned about her thyroid  function specifically T3 -I did explain to the patient that T3 and T4 lose clinical value once the patient is on LT-for replacement, and clinical decisions are typically made on TSH -No local neck symptoms -TSH remains within normal range   Medications :  Continue levothyroxine  125 mcg daily      2. Graves' Orbitopathy :   - Pt with hx of right eye sx secondary to Graves' disease in 2014.   - She was seen by [facility]  in the past       3.  Osteoporosis with T5 compression fracture:  -She has been diagnosed with  osteoporosis years ago, she was on Fosamax approximately for 2 years but she stopped it  in 2015 .  She has tried Prolia but that caused back pain - Forteo  started 09/2021 though pt assistant until April, 2025 -Emphasized the importance of optimizing vitamin D  and calcium intake   Medication Calcium 1200 mg daily Continue Forteo  20 mcg daily    4. Vitamin D  Insufficiency:   - Pt remain with low vitamin D  despite being on 2000iu daily   - Will start ergocalciferol  as below    Medication Ergocalciferol  50,000 weekly    Follow-up in 6 months    Abby Redgie Butts, MD  [facility] Endocrinology  [facility] [address] [place], [place] [address] Phone: [phone] FAX: [phone]      CC: Erick Greig LABOR, NP [address] [place] [address] Phone: [phone]  Fax: [phone]   Return to Endocrinology clinic as below: No future appointments.

## 2023-12-02 ENCOUNTER — Encounter: Payer: Self-pay | Admitting: Internal Medicine

## 2023-12-02 ENCOUNTER — Ambulatory Visit: Payer: Self-pay | Admitting: Internal Medicine

## 2023-12-02 MED ORDER — LEVOTHYROXINE SODIUM 112 MCG PO TABS: 112.0000 ug | ORAL_TABLET | Freq: Every day | ORAL | 2 refills | Status: AC

## 2023-12-02 MED ORDER — VITAMIN D (ERGOCALCIFEROL) 1.25 MG (50000 UNIT) PO CAPS: 50000.0000 [IU] | ORAL_CAPSULE | ORAL | 3 refills | Status: DC

## 2023-12-04 DIAGNOSIS — H05243 Constant exophthalmos, bilateral: Secondary | ICD-10-CM | POA: Diagnosis not present

## 2023-12-04 DIAGNOSIS — H0279 Other degenerative disorders of eyelid and periocular area: Secondary | ICD-10-CM | POA: Diagnosis not present

## 2023-12-04 DIAGNOSIS — E0500 Thyrotoxicosis with diffuse goiter without thyrotoxic crisis or storm: Secondary | ICD-10-CM | POA: Diagnosis not present

## 2023-12-04 DIAGNOSIS — H532 Diplopia: Secondary | ICD-10-CM | POA: Diagnosis not present

## 2023-12-04 DIAGNOSIS — H04123 Dry eye syndrome of bilateral lacrimal glands: Secondary | ICD-10-CM | POA: Diagnosis not present

## 2023-12-09 ENCOUNTER — Encounter: Payer: Self-pay | Admitting: Internal Medicine

## 2023-12-10 ENCOUNTER — Other Ambulatory Visit: Payer: Self-pay | Admitting: Internal Medicine

## 2023-12-11 DIAGNOSIS — M810 Age-related osteoporosis without current pathological fracture: Secondary | ICD-10-CM | POA: Diagnosis not present

## 2023-12-12 DIAGNOSIS — M810 Age-related osteoporosis without current pathological fracture: Secondary | ICD-10-CM | POA: Diagnosis not present

## 2023-12-15 DIAGNOSIS — G4733 Obstructive sleep apnea (adult) (pediatric): Secondary | ICD-10-CM | POA: Diagnosis not present

## 2023-12-17 ENCOUNTER — Telehealth: Payer: Self-pay | Admitting: Internal Medicine

## 2023-12-17 DIAGNOSIS — M810 Age-related osteoporosis without current pathological fracture: Secondary | ICD-10-CM

## 2023-12-17 NOTE — Telephone Encounter (Signed)
 Discussed DXA scan results with the patient on 12/17/2023 at 1450     T-Score Change 2022  LFN -2.8   LTH -2.8 Down 6%  RFN -3.0   RTH -3.1 Down 7%  33% distal radius  -0.7 Up 7%     We did discuss continued osteoporosis in the hips, we also discussed decreased BMD despite using Forteo , but it appears that Forteo  has helped with the distal radius    We discussed options to include Evenity, Prolia , Reclast infusion   The patient would like to proceed with Prolia , she did have 1 injection of Prolia  in the past that caused back pain, she does have chronic back pain     Will proceed with Prolia    Emphasized the importance of optimizing calcium, vitamin D , and weightbearing exercises   Abby Redgie Butts, MD  [facility] Endocrinology  [facility] [address] [place], [place] [address] Phone: [phone] FAX: [phone]

## 2023-12-17 NOTE — Telephone Encounter (Signed)
-----   Message from Nurse Dietrich PARAS sent at 12/15/2023  9:28 AM EDT ----- Dexa scan

## 2023-12-18 MED ORDER — DENOSUMAB 60 MG/ML ~~LOC~~ SOSY: 60.0000 mg | PREFILLED_SYRINGE | Freq: Once | SUBCUTANEOUS | Status: AC

## 2023-12-18 NOTE — Telephone Encounter (Signed)
 Prolia new start

## 2023-12-18 NOTE — Telephone Encounter (Signed)
Prolia VOB initiated via [email or web address]  Next Prolia inj DUE: new start

## 2023-12-18 NOTE — Telephone Encounter (Signed)
 OSTEOPOROSIS HISTORY : She has a hx of osteoporosis years ago, was on Fosamax for a couple yrs which was stopped in 2015.  Prolia  caused back pain. She had a compression fracture of T5 09/2020  on MRI    24-hr urinary cortisol was normal at 11 ug 12/2020   No prior history of cancer or radiation exposure    Forteo  was cost prohibitive and was provided with patient assistance which was approved in May 2023      Completed Forteo  April, 2025

## 2023-12-22 NOTE — Telephone Encounter (Signed)
 Pending

## 2023-12-23 DIAGNOSIS — G4733 Obstructive sleep apnea (adult) (pediatric): Secondary | ICD-10-CM | POA: Diagnosis not present

## 2023-12-26 NOTE — Telephone Encounter (Signed)
 Prolia  non-formulary with HTA

## 2023-12-29 DIAGNOSIS — I480 Paroxysmal atrial fibrillation: Secondary | ICD-10-CM | POA: Diagnosis not present

## 2023-12-29 DIAGNOSIS — Z6837 Body mass index (BMI) 37.0-37.9, adult: Secondary | ICD-10-CM | POA: Diagnosis not present

## 2023-12-29 DIAGNOSIS — M1990 Unspecified osteoarthritis, unspecified site: Secondary | ICD-10-CM | POA: Diagnosis not present

## 2023-12-29 DIAGNOSIS — Z79891 Long term (current) use of opiate analgesic: Secondary | ICD-10-CM | POA: Diagnosis not present

## 2023-12-29 DIAGNOSIS — E039 Hypothyroidism, unspecified: Secondary | ICD-10-CM | POA: Diagnosis not present

## 2023-12-29 DIAGNOSIS — F411 Generalized anxiety disorder: Secondary | ICD-10-CM | POA: Diagnosis not present

## 2023-12-29 DIAGNOSIS — E785 Hyperlipidemia, unspecified: Secondary | ICD-10-CM | POA: Diagnosis not present

## 2023-12-29 DIAGNOSIS — E1165 Type 2 diabetes mellitus with hyperglycemia: Secondary | ICD-10-CM | POA: Diagnosis not present

## 2023-12-29 DIAGNOSIS — J4540 Moderate persistent asthma, uncomplicated: Secondary | ICD-10-CM | POA: Diagnosis not present

## 2023-12-29 DIAGNOSIS — M810 Age-related osteoporosis without current pathological fracture: Secondary | ICD-10-CM | POA: Diagnosis not present

## 2023-12-29 NOTE — Telephone Encounter (Signed)
 Hi Dr. Sam,  With [employer] Medicare Adv HMO, Medicare Part B Drugs all have a 20% co-insurance. Evenity and Prolia  are non-formulary (formulary exemptions) with this plan.   Here are the preferred drug alternatives for EVENITY, it looks like Jubbonti is the lowest tier     https://[email or web address]/2025-pharmacy-and-prescription-drugs/2025-medication-look-up/#tab-id-2     Here are the preferred drug alternatives for PROLIA          https://healthteamadvantage.com/2025-pharmacy-and-prescription-drugs/2025-medication-look-up/#tab-id-2

## 2023-12-29 NOTE — Telephone Encounter (Signed)
 Patient advised and will wait to hear what cost is for the [facility]

## 2023-12-31 DIAGNOSIS — M170 Bilateral primary osteoarthritis of knee: Secondary | ICD-10-CM | POA: Diagnosis not present

## 2024-01-06 ENCOUNTER — Telehealth: Payer: Self-pay

## 2024-01-06 ENCOUNTER — Encounter: Payer: Self-pay | Admitting: Internal Medicine

## 2024-01-06 DIAGNOSIS — M810 Age-related osteoporosis without current pathological fracture: Secondary | ICD-10-CM | POA: Insufficient documentation

## 2024-01-06 NOTE — Telephone Encounter (Signed)
 Dr. Sam, patient will be scheduled as soon as possible.  Auth Submission: NO AUTH NEEDED Site of care: Site of care: [facility] Payer: [employer] Medication & CPT/J Code(s) submitted: Reclast (Zolendronic acid) [id number] Diagnosis Code:  Route of submission (phone, fax, portal):  Phone # Fax # Auth type: Buy/Bill PB Units/visits requested: 5mg  x 1 dose Reference number:  Approval from: 01/06/24 to 04/07/24

## 2024-01-06 NOTE — Addendum Note (Signed)
 Addended by: SAM DONELL PARAS on: 01/06/2024 12:00 PM   Modules accepted: Orders

## 2024-01-06 NOTE — Telephone Encounter (Signed)
 Spoke to Ms. Viktoria on 01/06/2024 discussed Evenity and Prolia  being nonformulary.   We discussed that insurance company will cover generic form but this has not been embedded in our system yet.   I have recommended proceeding with 1 dose of Reclast infusion while awaiting for the system to update the formulary   Caution against flulike symptoms with the first injection  All questions answered    Abby Redgie Butts, MD  [facility] Endocrinology  [facility] [address] [place], [place] [address] Phone: [phone] FAX: [phone]

## 2024-01-07 DIAGNOSIS — G8918 Other acute postprocedural pain: Secondary | ICD-10-CM | POA: Diagnosis not present

## 2024-01-07 DIAGNOSIS — M1711 Unilateral primary osteoarthritis, right knee: Secondary | ICD-10-CM | POA: Diagnosis not present

## 2024-01-07 DIAGNOSIS — M25761 Osteophyte, right knee: Secondary | ICD-10-CM | POA: Diagnosis not present

## 2024-01-07 DIAGNOSIS — Z96651 Presence of right artificial knee joint: Secondary | ICD-10-CM | POA: Diagnosis not present

## 2024-01-14 DIAGNOSIS — G4733 Obstructive sleep apnea (adult) (pediatric): Secondary | ICD-10-CM | POA: Diagnosis not present

## 2024-01-15 DIAGNOSIS — Z96651 Presence of right artificial knee joint: Secondary | ICD-10-CM | POA: Diagnosis not present

## 2024-01-15 DIAGNOSIS — M25561 Pain in right knee: Secondary | ICD-10-CM | POA: Diagnosis not present

## 2024-01-15 DIAGNOSIS — R262 Difficulty in walking, not elsewhere classified: Secondary | ICD-10-CM | POA: Diagnosis not present

## 2024-01-19 DIAGNOSIS — M25561 Pain in right knee: Secondary | ICD-10-CM | POA: Diagnosis not present

## 2024-01-19 DIAGNOSIS — R262 Difficulty in walking, not elsewhere classified: Secondary | ICD-10-CM | POA: Diagnosis not present

## 2024-01-25 NOTE — Telephone Encounter (Signed)
 Hi Dr. Sam,  How long after Reclast does patient need to wait before receiving Jubbonti? I have access to the provider portal now to check insurance benefits and prior authorization requirements.

## 2024-01-26 NOTE — Telephone Encounter (Signed)
 Hi Brandi,  If we can start her 6 months after the Reclast infusion that would be great.  Thanks

## 2024-01-28 DIAGNOSIS — M25561 Pain in right knee: Secondary | ICD-10-CM | POA: Diagnosis not present

## 2024-01-28 DIAGNOSIS — R262 Difficulty in walking, not elsewhere classified: Secondary | ICD-10-CM | POA: Diagnosis not present

## 2024-01-30 ENCOUNTER — Other Ambulatory Visit: Payer: Self-pay | Admitting: Internal Medicine

## 2024-02-04 DIAGNOSIS — M25561 Pain in right knee: Secondary | ICD-10-CM | POA: Diagnosis not present

## 2024-02-04 DIAGNOSIS — R262 Difficulty in walking, not elsewhere classified: Secondary | ICD-10-CM | POA: Diagnosis not present

## 2024-02-14 DIAGNOSIS — G4733 Obstructive sleep apnea (adult) (pediatric): Secondary | ICD-10-CM | POA: Diagnosis not present

## 2024-02-16 ENCOUNTER — Encounter: Payer: Self-pay | Admitting: Internal Medicine

## 2024-02-16 DIAGNOSIS — M1712 Unilateral primary osteoarthritis, left knee: Secondary | ICD-10-CM | POA: Diagnosis not present

## 2024-02-16 DIAGNOSIS — E890 Postprocedural hypothyroidism: Secondary | ICD-10-CM

## 2024-02-18 DIAGNOSIS — H35371 Puckering of macula, right eye: Secondary | ICD-10-CM | POA: Diagnosis not present

## 2024-02-18 DIAGNOSIS — E119 Type 2 diabetes mellitus without complications: Secondary | ICD-10-CM | POA: Diagnosis not present

## 2024-02-19 ENCOUNTER — Other Ambulatory Visit

## 2024-02-20 ENCOUNTER — Ambulatory Visit: Payer: Self-pay | Admitting: Internal Medicine

## 2024-02-20 LAB — TSH: TSH: 0.36 m[IU]/L — ABNORMAL LOW (ref 0.40–4.50)

## 2024-02-20 LAB — T4, FREE: Free T4: 1.5 ng/dL (ref 0.8–1.8)

## 2024-02-23 DIAGNOSIS — Z01419 Encounter for gynecological examination (general) (routine) without abnormal findings: Secondary | ICD-10-CM | POA: Diagnosis not present

## 2024-02-25 ENCOUNTER — Ambulatory Visit (INDEPENDENT_AMBULATORY_CARE_PROVIDER_SITE_OTHER): Admitting: Podiatry

## 2024-02-25 ENCOUNTER — Ambulatory Visit (INDEPENDENT_AMBULATORY_CARE_PROVIDER_SITE_OTHER)

## 2024-02-25 DIAGNOSIS — M79674 Pain in right toe(s): Secondary | ICD-10-CM

## 2024-02-25 DIAGNOSIS — B351 Tinea unguium: Secondary | ICD-10-CM

## 2024-02-25 DIAGNOSIS — M19071 Primary osteoarthritis, right ankle and foot: Secondary | ICD-10-CM

## 2024-02-25 DIAGNOSIS — M79675 Pain in left toe(s): Secondary | ICD-10-CM | POA: Diagnosis not present

## 2024-02-25 DIAGNOSIS — M25571 Pain in right ankle and joints of right foot: Secondary | ICD-10-CM

## 2024-02-25 NOTE — Progress Notes (Signed)
 Subjective:  Patient ID: Molly Patton, female    DOB: [birth year],  MRN: [id number]  Molly Patton presents to clinic today for:  Chief Complaint  Patient presents with   [facility]    Last A1c: 5.8. Takes Eliquis. Needs nail care.    Arthritis    Right foot. Would like injection today if possible. Primary location of pain is on top of the foot, across the foot lateral-medial.    Patient notes nails are thick, discolored, elongated and painful in shoegear when trying to ambulate.  She is requesting an injection in the right dorsal midfoot.  Her arthritis pain is flared up.  PCP is Moon, Amy A, NP.  Last seen 12/29/2023  Past Medical History:  Diagnosis Date   Anxiety    Arrhythmia    Asthma    Atrial fibrillation (HCC)    Chronic sialoadenitis    Depression    Diabetes (HCC)    Elevated cholesterol    History of colon polyps    Hypertension    Morbid (severe) obesity due to excess calories (HCC) 08/16/2022   bmi 46.26   Obesity    Sleep apnea    On CPAP Machine    Thyroid  disease    Graves disease s/p Iodine ablation now with hypothyroidism.   Past Surgical History:  Procedure Laterality Date   CARDIAC ELECTROPHYSIOLOGY MAPPING AND ABLATION     COLONOSCOPY  09/14/2014   Colonic polyps status post polypectomy. Mild sigmoid diverticulosis   EXCISION OF BREAST LESION  09/04/2007   Small fibroadenoma w/ coarse clustered microcalcification. Dr Janit   eye decompression     ROTATOR CUFF REPAIR     SUBMANDIBULAR GLAND EXCISION Right 10/06/2001   Dr devora    Allergies  Allergen Reactions   Aleve [Naproxen Sodium] Swelling   Naproxen Swelling    Review of Systems: Negative except as noted in the HPI.  Objective:  Molly Patton is a pleasant 68 y.o. female in NAD. AAO x 3.  Vascular Examination: Capillary refill time is 3-5 seconds to toes bilateral. Palpable pedal pulses b/l LE. Digital hair sparse b/l.  Skin temperature gradient WNL b/l.  +1 pitting edema  bilateral legs and ankles.  Dermatological Examination: Pedal skin with decreased turgor, texture and tone b/l. No open wounds. No interdigital macerations b/l. Toenails x10 are 3mm thick, discolored, dystrophic with subungual debris. There is pain with compression of the nail plates.  They are elongated x10  Orthopedic examination: There is pain on palpation to the dorsal aspect of the right midfoot.  This includes pain on palpation of the tarsometatarsal joints and midtarsal joints dorsally.  No pain with ankle range of motion is noted.  Ankle dorsiflexion is less than 10 degrees with the knee extended.  No ecchymosis or erythema is noted.  Radiology (right foot, 3 weightbearing views, 02/25/2024): Decreased osseous mineralization.  Severe joint space narrowing at the navicular-cuneiform joints with subchondral sclerosis noted.  Decreased joint space at the 2nd and 3rd metatarsal-cuneiform joints.  Mild increase in first intermetatarsal angle.  Thickening of the shaft of the fifth proximal phalanx indicative of possible prior fracture.  Inferior calcaneal spur noted.  Degenerative joint changes across the dorsal aspect of the tarsometatarsal and midtarsal joints.  Assessment/Plan: 1. Osteoarthritis of right ankle and foot   2. Pain due to onychomycosis of toenails of both feet   3. Pain in joint of right foot     With the patient's  consent, the dorsal right midfoot near the navicular-intermediate cuneiform joint/second metatarsal-intermediate cuneiform joint was injected with a mixture of 1% lidocaine plain, 0.5% Marcaine plain and Kenalog  10 for total of 1.25 cc administered.  This was administered following a Betadine prep to the area.  She will keep the area clean and covered for approximately 24 hours.  The mycotic toenails were sharply debrided x10 with sterile nail nippers and a power debriding burr to decrease bulk/thickness and length.    Return in about 3 months (around 05/27/2024) for  [facility].   Awanda CHARM [facility], DPM, FACFAS [facility]     [address] [place], [place] [address]                Office [phone]  Fax [phone]

## 2024-03-01 ENCOUNTER — Encounter: Payer: Self-pay | Admitting: Internal Medicine

## 2024-03-05 NOTE — Telephone Encounter (Signed)
 Reclast injection has not yet been administered.

## 2024-03-08 ENCOUNTER — Ambulatory Visit (INDEPENDENT_AMBULATORY_CARE_PROVIDER_SITE_OTHER)

## 2024-03-08 VITALS — BP 112/69 | HR 74 | Temp 98.5°F | Resp 16 | Ht 63.5 in | Wt 217.2 lb

## 2024-03-08 DIAGNOSIS — M810 Age-related osteoporosis without current pathological fracture: Secondary | ICD-10-CM

## 2024-03-08 MED ORDER — ZOLEDRONIC ACID 5 MG/100ML IV SOLN
5.0000 mg | Freq: Once | INTRAVENOUS | Status: AC
  Administered 2024-03-08: 5 mg via INTRAVENOUS
  Filled 2024-03-08: qty 100

## 2024-03-08 MED ORDER — ACETAMINOPHEN 325 MG PO TABS
650.0000 mg | ORAL_TABLET | Freq: Once | ORAL | Status: AC
  Administered 2024-03-08: 650 mg via ORAL
  Filled 2024-03-08: qty 2

## 2024-03-08 MED ORDER — DIPHENHYDRAMINE HCL 25 MG PO CAPS
25.0000 mg | ORAL_CAPSULE | Freq: Once | ORAL | Status: AC
  Administered 2024-03-08: 25 mg via ORAL
  Filled 2024-03-08: qty 1

## 2024-03-08 NOTE — Progress Notes (Signed)
 Diagnosis: Osteoporosis  Provider:  Mannam, Praveen MD  Procedure: IV Infusion  IV Type: Peripheral, IV Location: R Forearm  Reclast (Zolendronic Acid), Dose: 5 mg  Infusion Start Time: 1355  Infusion Stop Time: 1425  Post Infusion IV Care: Observation period completed and Peripheral IV Discontinued  Discharge: Condition: Stable, Destination: Home . AVS Provided  Performed by:  Rocky FORBES Sar, RN

## 2024-03-10 DIAGNOSIS — M25511 Pain in right shoulder: Secondary | ICD-10-CM | POA: Diagnosis not present

## 2024-03-10 DIAGNOSIS — G8929 Other chronic pain: Secondary | ICD-10-CM | POA: Diagnosis not present

## 2024-03-18 ENCOUNTER — Ambulatory Visit: Admitting: Podiatry

## 2024-04-03 ENCOUNTER — Other Ambulatory Visit: Payer: Self-pay | Admitting: Internal Medicine

## 2024-04-10 NOTE — Telephone Encounter (Signed)
 Pt received Reclast  infusion 03/08/24.   Will run benefits for Jubbonti 08/2024 for administration 09/2024.

## 2024-05-26 ENCOUNTER — Ambulatory Visit: Admitting: Podiatry

## 2024-06-08 ENCOUNTER — Ambulatory Visit: Admitting: Internal Medicine
